# Patient Record
Sex: Female | Born: 1975 | Race: Black or African American | Hispanic: No | Marital: Married | Smoking: Never smoker
Health system: Southern US, Community
[De-identification: ages and names within clinical notes are randomized; demographics above are authoritative.]

## PROBLEM LIST (undated history)

## (undated) DIAGNOSIS — R531 Weakness: Secondary | ICD-10-CM

## (undated) DIAGNOSIS — R200 Anesthesia of skin: Secondary | ICD-10-CM

## (undated) HISTORY — DX: Anesthesia of skin: R20.0

## (undated) HISTORY — PX: OTHER SURGICAL HISTORY: SHX169

## (undated) HISTORY — DX: Weakness: R53.1

## (undated) HISTORY — PX: ANKLE SURGERY: SHX546

---

## 2016-08-11 ENCOUNTER — Other Ambulatory Visit: Payer: Self-pay | Admitting: Obstetrics and Gynecology

## 2016-08-11 ENCOUNTER — Other Ambulatory Visit (HOSPITAL_COMMUNITY)
Admission: RE | Admit: 2016-08-11 | Discharge: 2016-08-11 | Disposition: A | Discharge status: Home / Self Care | Payer: Managed Care, Other (non HMO) | Source: Ambulatory Visit | Attending: Obstetrics and Gynecology | Admitting: Obstetrics and Gynecology

## 2016-08-11 DIAGNOSIS — Z1231 Encounter for screening mammogram for malignant neoplasm of breast: Secondary | ICD-10-CM

## 2016-08-11 DIAGNOSIS — Z113 Encounter for screening for infections with a predominantly sexual mode of transmission: Secondary | ICD-10-CM | POA: Insufficient documentation

## 2016-08-11 DIAGNOSIS — Z1151 Encounter for screening for human papillomavirus (HPV): Secondary | ICD-10-CM | POA: Insufficient documentation

## 2016-08-11 DIAGNOSIS — Z01411 Encounter for gynecological examination (general) (routine) with abnormal findings: Secondary | ICD-10-CM | POA: Insufficient documentation

## 2016-08-13 LAB — CYTOLOGY - PAP
CHLAMYDIA, DNA PROBE: NEGATIVE
DIAGNOSIS: NEGATIVE
HPV: NOT DETECTED
NEISSERIA GONORRHEA: NEGATIVE

## 2016-09-08 ENCOUNTER — Encounter: Payer: Self-pay | Admitting: Radiology

## 2016-09-08 ENCOUNTER — Ambulatory Visit
Admission: RE | Admit: 2016-09-08 | Discharge: 2016-09-08 | Disposition: A | Discharge status: Home / Self Care | Payer: Managed Care, Other (non HMO) | Source: Ambulatory Visit | Attending: Obstetrics and Gynecology | Admitting: Obstetrics and Gynecology

## 2016-09-08 DIAGNOSIS — Z1231 Encounter for screening mammogram for malignant neoplasm of breast: Secondary | ICD-10-CM

## 2018-03-17 ENCOUNTER — Other Ambulatory Visit: Payer: Self-pay

## 2018-03-17 ENCOUNTER — Emergency Department (HOSPITAL_BASED_OUTPATIENT_CLINIC_OR_DEPARTMENT_OTHER): Payer: 59

## 2018-03-17 ENCOUNTER — Encounter (HOSPITAL_BASED_OUTPATIENT_CLINIC_OR_DEPARTMENT_OTHER): Payer: Self-pay

## 2018-03-17 ENCOUNTER — Emergency Department (HOSPITAL_BASED_OUTPATIENT_CLINIC_OR_DEPARTMENT_OTHER)
Admission: EM | Admit: 2018-03-17 | Discharge: 2018-03-17 | Disposition: A | Discharge status: Home / Self Care | Payer: 59 | Attending: Emergency Medicine | Admitting: Emergency Medicine

## 2018-03-17 DIAGNOSIS — R200 Anesthesia of skin: Secondary | ICD-10-CM | POA: Diagnosis not present

## 2018-03-17 DIAGNOSIS — R531 Weakness: Secondary | ICD-10-CM | POA: Diagnosis not present

## 2018-03-17 LAB — COMPREHENSIVE METABOLIC PANEL
ALBUMIN: 4 g/dL (ref 3.5–5.0)
ALK PHOS: 60 U/L (ref 38–126)
ALT: 13 U/L — ABNORMAL LOW (ref 14–54)
ANION GAP: 7 (ref 5–15)
AST: 15 U/L (ref 15–41)
BUN: 11 mg/dL (ref 6–20)
CO2: 24 mmol/L (ref 22–32)
Calcium: 8.7 mg/dL — ABNORMAL LOW (ref 8.9–10.3)
Chloride: 107 mmol/L (ref 101–111)
Creatinine, Ser: 0.7 mg/dL (ref 0.44–1.00)
GFR calc Af Amer: >60 mL/min (ref >60)
GFR calc non Af Amer: >60 mL/min (ref >60)
GLUCOSE: 90 mg/dL (ref 65–99)
POTASSIUM: 3.9 mmol/L (ref 3.5–5.1)
SODIUM: 138 mmol/L (ref 135–145)
Total Bilirubin: 0.5 mg/dL (ref 0.3–1.2)
Total Protein: 7.5 g/dL (ref 6.5–8.1)

## 2018-03-17 LAB — CBC WITH DIFFERENTIAL/PLATELET
BASOS PCT: 0 %
Basophils Absolute: 0 10*3/uL (ref 0.0–0.1)
Eosinophils Absolute: 0.1 10*3/uL (ref 0.0–0.7)
Eosinophils Relative: 3 %
HEMATOCRIT: 37.2 % (ref 36.0–46.0)
HEMOGLOBIN: 12.7 g/dL (ref 12.0–15.0)
LYMPHS PCT: 56 %
Lymphs Abs: 2.5 10*3/uL (ref 0.7–4.0)
MCH: 29.6 pg (ref 26.0–34.0)
MCHC: 34.1 g/dL (ref 30.0–36.0)
MCV: 86.7 fL (ref 78.0–100.0)
Monocytes Absolute: 0.5 10*3/uL (ref 0.1–1.0)
Monocytes Relative: 11 %
NEUTROS ABS: 1.3 10*3/uL — AB (ref 1.7–7.7)
NEUTROS PCT: 30 %
Platelets: 217 10*3/uL (ref 150–400)
RBC: 4.29 MIL/uL (ref 3.87–5.11)
RDW: 13.4 % (ref 11.5–15.5)
WBC: 4.4 10*3/uL (ref 4.0–10.5)

## 2018-03-17 LAB — URINALYSIS, ROUTINE W REFLEX MICROSCOPIC
Bilirubin Urine: NEGATIVE
Glucose, UA: NEGATIVE mg/dL
HGB URINE DIPSTICK: NEGATIVE
Ketones, ur: NEGATIVE mg/dL
LEUKOCYTES UA: NEGATIVE
Nitrite: NEGATIVE
Protein, ur: NEGATIVE mg/dL
SPECIFIC GRAVITY, URINE: 1.015 (ref 1.005–1.030)
pH: 7 (ref 5.0–8.0)

## 2018-03-17 LAB — PREGNANCY, URINE: Preg Test, Ur: NEGATIVE

## 2018-03-17 NOTE — ED Provider Notes (Signed)
Emergency Department Provider Note   I have reviewed the triage vital signs and the nursing notes.   HISTORY  Chief Complaint Numbness   HPI Rebecca Holden is a 42 y.o. female with no significant past medical history presents to the emergency department with intermittent numbness going in the left upper extremity and left thigh.  Patient has had some associated weakness.  Symptoms have been intermittent over the past 2 weeks but over the past 2 days have become more constant.  She denies any face or vision symptoms.  No headaches.  No fevers or chills.  History of similar.  No family history of early strokes.  No radiation of symptoms or modifying factors.  No pain.   History reviewed. No pertinent past medical history.  There are no active problems to display for this patient.   History reviewed. No pertinent surgical history.    Allergies Patient has no known allergies.  No family history on file.  Social History Social History   Tobacco Use  . Smoking status: Never Smoker  . Smokeless tobacco: Never Used  Substance Use Topics  . Alcohol use: Yes    Comment: occ  . Drug use: Never    Review of Systems  Constitutional: No fever/chills Eyes: No visual changes. ENT: No sore throat. Cardiovascular: Denies chest pain. Respiratory: Denies shortness of breath. Gastrointestinal: No abdominal pain.  No nausea, no vomiting.  No diarrhea.  No constipation. Genitourinary: Negative for dysuria. Musculoskeletal: Negative for back pain. Skin: Negative for rash. Neurological: Negative for headaches. Positive subjective weakness in the LUE. Positive numbness in the LUE and LLE (thigh only).   10-point ROS otherwise negative.  ____________________________________________   PHYSICAL EXAM:  VITAL SIGNS: ED Triage Vitals  Enc Vitals Group     BP 03/17/18 1310 122/75     Pulse Rate 03/17/18 1310 (!) 53     Resp 03/17/18 1310 18     Temp 03/17/18 1310 97.9 F (36.6  C)     Temp Source 03/17/18 1310 Oral     SpO2 03/17/18 1310 100 %     Weight 03/17/18 1311 197 lb 8.5 oz (89.6 kg)     Height 03/17/18 1311  (1.575 m)     Pain Score 03/17/18 1309 0   Constitutional: Alert and oriented. Well appearing and in no acute distress. Eyes: Conjunctivae are normal. PERRL. EOMI. Head: Atraumatic. Nose: No congestion/rhinnorhea. Mouth/Throat: Mucous membranes are moist.   Neck: No stridor.  Cardiovascular: Normal rate, regular rhythm. Good peripheral circulation. Grossly normal heart sounds.   Respiratory: Normal respiratory effort.  No retractions. Lungs CTAB. Gastrointestinal: Soft and nontender. No distention.  Musculoskeletal: No lower extremity tenderness nor edema. No gross deformities of extremities. Neurologic:  Normal speech and language. Normal CN exam 2-12. 4+/5 grip strength on the LUE but no pronator drift. Subjective decreased sensation to light touch over the LUE compared to the right.  Skin:  Skin is warm, dry and intact. No rash noted.   ____________________________________________   LABS (all labs ordered are listed, but only abnormal results are displayed)  Labs Reviewed  URINALYSIS, ROUTINE W REFLEX MICROSCOPIC - Abnormal; Notable for the following components:      Result Value   APPearance HAZY (*)    All other components within normal limits  CBC WITH DIFFERENTIAL/PLATELET - Abnormal; Notable for the following components:   Neutro Abs 1.3 (*)    All other components within normal limits  COMPREHENSIVE METABOLIC PANEL - Abnormal; Notable  for the following components:   Calcium 8.7 (*)    ALT 13 (*)    All other components within normal limits  PREGNANCY, URINE   ____________________________________________  EKG   EKG Interpretation  Date/Time:  Wednesday Mar 17 2018 15:17:36 EDT Ventricular Rate:  50 PR Interval:    QRS Duration: 85 QT Interval:  478 QTC Calculation: 436 R Axis:   47 Text Interpretation:  Sinus  rhythm Prolonged PR interval No STEMI.  Confirmed by Alona Bene [phone]) on 03/17/2018 3:26:46 PM       ____________________________________________  RADIOLOGY  Ct Head Wo Contrast  Result Date: 03/17/2018 CLINICAL DATA:  42 year old female with LEFT arm and LEFT leg numbness and dizziness for 2 weeks EXAM: CT HEAD WITHOUT CONTRAST TECHNIQUE: Contiguous axial images were obtained from the base of the skull through the vertex without intravenous contrast. COMPARISON:  None. FINDINGS: Brain: No evidence of infarction, hemorrhage, hydrocephalus, extra-axial collection or mass lesion/mass effect. Vascular: No hyperdense vessel or unexpected calcification. Skull: Normal. Negative for fracture or focal lesion. Sinuses/Orbits: No acute finding. Other: None. IMPRESSION: Unremarkable noncontrast head CT. Electronically Signed   By: Harmon Pier M.D.   On: 03/17/2018 15:43    ____________________________________________   PROCEDURES  Procedure(s) performed:   Procedures  None ____________________________________________   INITIAL IMPRESSION / ASSESSMENT AND PLAN / ED COURSE  Pertinent labs & imaging results that were available during my care of the patient were reviewed by me and considered in my medical decision making (see chart for details).  Patient presents to the emergency department for evaluation of tingling and numbness in the left upper extremity as well as left thigh.  There is some subjective numbness with a slight decreased grip strength on the left.  No significant pronator drift.  Patient has normal cranial nerve exam.  Plan for CT head, labs, EKG, along with tele-neurology consult.  EKG reviewed with no acute findings. Spoke with Tele-Neurology Dr. Ezzard Standing who will perform an on-screen evaluation of the patient and give further recommendations.   Spoke with Dr. Ezzard Standing on screen after evaluation. Plan for discharge for follow up with outpatient Neurology. Provided ambulatory  referral for [facility] but also contact information for multiple local neurology groups. Patient verbalizes understanding.   At this time, I do not feel there is any life-threatening condition present. I have reviewed and discussed all results (EKG, imaging, lab, urine as appropriate), exam findings with patient. I have reviewed nursing notes and appropriate previous records.  I feel the patient is safe to be discharged home without further emergent workup. Discussed usual and customary return precautions. Patient and family (if present) verbalize understanding and are comfortable with this plan.  Patient will follow-up with their primary care provider. If they do not have a primary care provider, information for follow-up has been provided to them. All questions have been answered.  ____________________________________________  FINAL CLINICAL IMPRESSION(S) / ED DIAGNOSES  Final diagnoses:  Numbness     Note:  This document was prepared using Dragon voice recognition software and may include unintentional dictation errors.  Alona Bene, MD Emergency Medicine    Mckyle Solanki, Arlyss Repress, MD 03/18/18 [phone]

## 2018-03-17 NOTE — ED Notes (Signed)
Pt on monitor 

## 2018-03-17 NOTE — Discharge Instructions (Signed)
You were seen in the ED today with numbness. There is some concern for possible Multiple Sclerosis but more testing is needed before we can make this diagnosis. I am listing two Neurology groups in [place] to call today and try to make an appointment ASAP. They will need to schedule an MRI from there.   You can also call [facility] at [phone] as a local option.   Return to the ED with any new or worsening symptoms.

## 2018-03-17 NOTE — Consult Note (Signed)
   [facility] Consult Services  Date of Service: 5/20 Rebecca Holden 2019  Impression:  1. Symptoms and clinical course not consistent with stroke 2. I am concerned about the possibility of multiple sclerosis.  Recommendations: discharge, refer to local neurologist for MRI without and with contrast and follow-up.  ---------------------------------------------------------------------  CC: left-sided numbness and weakness  History of Present Illness: this is a 42 year old woman previous in good health [name] two weeks his head intermittent numbness of her left thigh than her left arm. She has dizziness and what she means is that would she stand she veers to one side. This is visible. She has some left arm and leg weakness. She has no difficulty with thinking, vision loss, diplopia, or swallowing. She has no history of hypertension and her blood pressure is 122/79. She is no history of diabetes, coronary disease, atrial fibrillation, hyperlipidemia, and she is not anticoagulated. She is a non-smoker.   Diagnostic Testing: CT brain was essentially normal although I wonder about white matter Lucent C.  Vital Signs: as above  Exam:  Mental Status:  Awake, alert, oriented  Speech: fluent  Cranial Nerves:  Extraocular movements: Intact in all cardinal gaze, no nystagmus Ptosis: Absent Visual fields: Intact to finger counting Facial sensation: Intact to light touch Facial movements: Intact and symmetric    Motor Exam:  mild drift left arm and leg   Tremor/Abnormal Movements:  Resting tremor: Absent Intention tremor: Absent Postural tremor: Absent  Sensory Exam:   Light touch: diminished on left compared to right    Coordination:   Finger to nose: Intact Heel to shin: mildly ataxic on the left  Medical Decision Making:  - Extensive number of diagnosis or management options are considered above.   - Extensive amount of complex data reviewed.   - High risk of  complication and/or morbidity or mortality are associated with differential diagnostic considerations above.  - There may be uncertain outcome and increased probability of prolonged functional impairment or high probability of severe prolonged functional impairment associated with some of these differential diagnosis.   Medical Data Reviewed:  1.Data reviewed include clinical labs, radiology,  Medical Tests;   2.Tests results discussed w/performing or interpreting physician;   3.Obtaining/reviewing old medical records;  4.Obtaining case history from another source;  5.Independent review of image, tracing or specimen.    Patient was informed the Neurology Consult would happen via TeleHealth consult by way of interactive audio and video telecommunications and consented to receiving care in this manner.

## 2018-03-17 NOTE — ED Triage Notes (Signed)
C/o numbness to left UE and LE x 2 weeks-denies injury-NAD-steady gait

## 2018-03-17 NOTE — ED Notes (Signed)
[facility] neuro eval at bedside

## 2018-03-23 ENCOUNTER — Telehealth: Payer: Self-pay | Admitting: Neurology

## 2018-03-23 ENCOUNTER — Ambulatory Visit (INDEPENDENT_AMBULATORY_CARE_PROVIDER_SITE_OTHER): Payer: 59 | Admitting: Neurology

## 2018-03-23 ENCOUNTER — Encounter: Payer: Self-pay | Admitting: Neurology

## 2018-03-23 VITALS — BP 110/73 | HR 57 | Ht 62.0 in | Wt 198.0 lb

## 2018-03-23 DIAGNOSIS — R202 Paresthesia of skin: Secondary | ICD-10-CM | POA: Insufficient documentation

## 2018-03-23 NOTE — Progress Notes (Signed)
PATIENT: Rebecca Holden DOB: 05/26/1976  Chief Complaint  Patient presents with  . Numbness    Reports intermittent numbness in left upper extremity and left thigh.  She is also experiencing weakness.  Symptoms have been present for at least three weeks.  Recent visit to ED with unremarkable CT head performed.  Marland Kitchen. PCP    Mila PalmerWolters, Sharon, MD (referred from ED)     HISTORICAL  Rebecca Holden is a 42 years old female, seen in refer by her primary care physician Dr. Paulino RilyWolters, Jasmine DecemberSharon for evaluation of left-sided numbness, initial evaluation was on March 23, 2018.  Around 3 weeks ago in the middle of the May 2019, without clear triggers, she began to experience intermittent left-sided numbness, started by left anterior thigh numbness, lasting for a few seconds, but multiple spells in a day, at the end of May 2019, she also noticed numbness spreading to involving her left arm, sparing left face, more frequent, longer lasting up to few minutes, she also felt subjective weakness of left arm and left leg during the spells, she denied loss of consciousness, no headaches,  She also describes one episode in early May 2019, after hearing a joke, she laughed so hard, she felt transient "shut down", she is a wearing of the surroundings, could not move, she has no loss of consciousness, lasting for few seconds.  She presented to the emergency room on Mar 17, 2018, Had a CT head without contrast showed no significant abnormality. Laboratory showed normal CBC CMP  REVIEW OF SYSTEMS: Full 14 system review of systems performed and notable only for numbness, weakness, dizziness  ALLERGIES: No Known Allergies  HOME MEDICATIONS: No current outpatient medications on file.   No current facility-administered medications for this visit.     PAST MEDICAL HISTORY: Past Medical History:  Diagnosis Date  . Numbness   . Weakness     PAST SURGICAL HISTORY: Past Surgical History:  Procedure Laterality  Date  . ANKLE SURGERY Right   . c-section     x 2    FAMILY HISTORY: Family History  Problem Relation Age of Onset  . Healthy Mother   . CVA Father     SOCIAL HISTORY:  Social History   Socioeconomic History  . Marital status: Married    Spouse name: Not on file  . Number of children: 2  . Years of education: 16  . Highest education level: Bachelor's degree (e.g., BA, AB, BS)  Occupational History  . 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 Financial resource strain: Not on file  . Food insecurity:    Worry: Not on file    Inability: Not on file  . Transportation needs:    Medical: Not on file    Non-medical: Not on file  Tobacco Use  . Smoking status: Never Smoker  . Smokeless tobacco: Never Used  Substance and Sexual Activity  . Alcohol use: Yes    Comment: one drink per week  . Drug use: Never  . Sexual activity: Not on file  Lifestyle  . Physical activity:    Days per week: Not on file    Minutes per session: Not on file  . Stress: Not on file  Relationships  . Social connections:    Talks on phone: Not on file    Gets together: Not on file    Attends religious service: Not on file    Active member of club or organization: Not on file  Attends meetings of clubs or organizations: Not on file    Relationship status: Not on file  . Intimate partner violence:    Fear of current or ex partner: Not on file    Emotionally abused: Not on file    Physically abused: Not on file    Forced sexual activity: Not on file  Other Topics Concern  . Not on file  Social History Narrative   Lives at home with husband and two children.   Right-handed.   One cup caffeine daily.     PHYSICAL EXAM   Vitals:   03/23/18 0723  BP: 110/73  Pulse: (!) 57  Weight: 198 lb (89.8 kg)  Height: 5\' 2"  (1.575 m)    Not recorded      Body mass index is 36.21 kg/m.  PHYSICAL EXAMNIATION:  Gen: NAD, conversant, well nourised, obese, well groomed                      Cardiovascular: Regular rate rhythm, no peripheral edema, warm, nontender. Eyes: Conjunctivae clear without exudates or hemorrhage Neck: Supple, no carotid bruits. Pulmonary: Clear to auscultation bilaterally   NEUROLOGICAL EXAM:  MENTAL STATUS: Speech:    Speech is normal; fluent and spontaneous with normal comprehension.  Cognition:     Orientation to time, place and person     Normal recent and remote memory     Normal Attention span and concentration     Normal Language, naming, repeating,spontaneous speech     Fund of knowledge   CRANIAL NERVES: CN II: Visual fields are full to confrontation. Fundoscopic exam is normal with sharp discs and no vascular changes. Pupils are round equal and briskly reactive to light. CN III, IV, VI: extraocular movement are normal. No ptosis. CN V: Facial sensation is intact to pinprick in all 3 divisions bilaterally. Corneal responses are intact.  CN VII: Face is symmetric with normal eye closure and smile. CN VIII: Hearing is normal to rubbing fingers CN IX, X: Palate elevates symmetrically. Phonation is normal. CN XI: Head turning and shoulder shrug are intact CN XII: Tongue is midline with normal movements and no atrophy.  MOTOR: There is no pronator drift of out-stretched arms. Muscle bulk and tone are normal. Muscle strength is normal.  REFLEXES: Reflexes are 2+ and symmetric at the biceps, triceps, knees, and ankles. Plantar responses are flexor.  SENSORY: Intact to light touch, pinprick, positional sensation and vibratory sensation are intact in fingers and toes.  COORDINATION: Rapid alternating movements and fine finger movements are intact. There is no dysmetria on finger-to-nose and heel-knee-shin.    GAIT/STANCE: Posture is normal. Gait is steady with normal steps, base, arm swing, and turning. Heel and toe walking are normal. Tandem gait is normal.  Romberg is absent.   DIAGNOSTIC DATA (LABS, IMAGING,  TESTING) - I reviewed patient records, labs, notes, testing and imaging myself where available.   ASSESSMENT AND PLAN  Rebecca Holden is a 42 y.o. female   Intermittent left-sided paresthesia involving left leg, left arm,  Need to rule out right hemisphere pathology  Proceed with MRI of brain without contrast  Laboratory evaluation including TSH, B12   Levert Feinstein, M.D. Ph.D.  [facility] [address] [place], [place] [address] Ph: [phone] Fax: [phone]  CC: Mila Palmer, MD

## 2018-03-23 NOTE — Telephone Encounter (Signed)
[employer] order sent to GI. They obtain the auth and will reach out to the pt to schedule.  °

## 2018-03-24 ENCOUNTER — Telehealth: Payer: Self-pay | Admitting: *Deleted

## 2018-03-24 LAB — RPR: RPR Ser Ql: NONREACTIVE

## 2018-03-24 LAB — TSH: TSH: 1.11 u[IU]/mL (ref 0.450–4.500)

## 2018-03-24 LAB — VITAMIN B12: Vitamin B-12: 774 pg/mL (ref 232–1245)

## 2018-03-24 NOTE — Telephone Encounter (Signed)
Left patient a detailed message, with results, on voicemail (ok per DPR).  Provided our number to call back with any questions.  

## 2018-03-24 NOTE — Telephone Encounter (Signed)
-----   Message from Yijun Yan, MD sent at 03/24/2018 11:34 AM EDT ----- Please call patient for normal laboratory result 

## 2018-03-29 ENCOUNTER — Emergency Department (HOSPITAL_COMMUNITY): Payer: 59

## 2018-03-29 ENCOUNTER — Emergency Department (HOSPITAL_BASED_OUTPATIENT_CLINIC_OR_DEPARTMENT_OTHER)
Admission: EM | Admit: 2018-03-29 | Discharge: 2018-03-30 | Disposition: A | Discharge status: Home / Self Care | Payer: 59 | Attending: Emergency Medicine | Admitting: Emergency Medicine

## 2018-03-29 ENCOUNTER — Encounter (HOSPITAL_BASED_OUTPATIENT_CLINIC_OR_DEPARTMENT_OTHER): Payer: Self-pay

## 2018-03-29 ENCOUNTER — Other Ambulatory Visit: Payer: Self-pay

## 2018-03-29 DIAGNOSIS — R200 Anesthesia of skin: Secondary | ICD-10-CM | POA: Diagnosis not present

## 2018-03-29 DIAGNOSIS — R29898 Other symptoms and signs involving the musculoskeletal system: Secondary | ICD-10-CM | POA: Diagnosis not present

## 2018-03-29 NOTE — ED Notes (Signed)
Report given to Baird Lyonsasey, RN, [facility]

## 2018-03-29 NOTE — ED Notes (Signed)
Pt updated on wait time for MRI and thanked for being patient. Pt verbalized understanding

## 2018-03-29 NOTE — ED Notes (Signed)
This RN called MRI and they stated it would be at least 2 hours until she will be scanned

## 2018-03-29 NOTE — ED Notes (Signed)
Pt states that earlier today she had some numbness to left side of face, that has somewhat resolved.  Pt states that she also feels like the numbness that has been present on the left side, is worse today.  According to friend that was with her, when she had the facial numbness, she did not have a droop, speech was clear and pt was able to swallow PO fluids at the time without difficulty.

## 2018-03-29 NOTE — ED Notes (Signed)
Pt states seen here a few weeks ago for numbness to the lt side and has a appt for an MRI on Thursday, states today the numbness is going up the lt side of face/neck; no distress noted

## 2018-03-29 NOTE — ED Provider Notes (Signed)
[facility] EMERGENCY DEPARTMENT Provider Note   CSN: [id number] Arrival date & time: 03/29/18  1607     History   Chief Complaint Chief Complaint  Patient presents with  . Numbness    HPI Rebecca Holden is a 42 y.o. female.  The history is provided by the patient, a friend and medical records.  Neurologic Problem  This is a new problem. The current episode started more than 1 week ago. The problem occurs constantly. The problem has been gradually worsening. Pertinent negatives include no chest pain, no abdominal pain, no headaches and no shortness of breath. Nothing aggravates the symptoms. Nothing relieves the symptoms. She has tried nothing for the symptoms. The treatment provided no relief.    Past Medical History:  Diagnosis Date  . Numbness   . Weakness     Patient Active Problem List   Diagnosis Date Noted  . Paresthesia of left arm and leg 03/23/2018    Past Surgical History:  Procedure Laterality Date  . ANKLE SURGERY Right   . c-section     x 2     OB History   None      Home Medications    Prior to Admission medications   Not on File    Family History Family History  Problem Relation Age of Onset  . Healthy Mother   . CVA Father     Social History Social History   Tobacco Use  . Smoking status: Never Smoker  . Smokeless tobacco: Never Used  Substance Use Topics  . Alcohol use: Yes    Comment: one drink per week  . Drug use: Never     Allergies   Patient has no known allergies.   Review of Systems Review of Systems  Constitutional: Negative for chills, diaphoresis, fatigue and fever.  HENT: Negative for congestion.   Eyes: Negative for photophobia and visual disturbance.  Respiratory: Negative for cough, chest tightness, shortness of breath and wheezing.   Cardiovascular: Negative for chest pain.  Gastrointestinal: Negative for abdominal pain.  Genitourinary: Negative for flank pain.  Musculoskeletal: Negative  for back pain, neck pain and neck stiffness.  Neurological: Positive for weakness and numbness. Negative for dizziness, seizures, syncope, facial asymmetry, speech difficulty, light-headedness and headaches.  Psychiatric/Behavioral: Negative for agitation.  All other systems reviewed and are negative.    Physical Exam Updated Vital Signs BP 111/74   Pulse 64   Temp 98.3 F (36.8 C) (Oral)   Resp 16   Ht 5\' 2"  (1.575 m)   Wt 88.9 kg (196 lb)   LMP 02/28/2018   SpO2 100%   BMI 35.85 kg/m   Physical Exam  Constitutional: She is oriented to person, place, and time. She appears well-developed and well-nourished. No distress.  HENT:  Head: Normocephalic and atraumatic.  Nose: Nose normal.  Mouth/Throat: Oropharynx is clear and moist. No oropharyngeal exudate.  Eyes: Pupils are equal, round, and reactive to light. Conjunctivae and EOM are normal.  Neck: Normal range of motion. Neck supple.  Cardiovascular: Normal rate and regular rhythm. Exam reveals no gallop.  No murmur heard. Pulmonary/Chest: Effort normal and breath sounds normal. No respiratory distress. She has no wheezes. She exhibits no tenderness.  Abdominal: Soft. There is no tenderness.  Musculoskeletal: She exhibits no edema or tenderness.  Lymphadenopathy:    She has no cervical adenopathy.  Neurological: She is alert and oriented to person, place, and time. She is not disoriented. She displays no tremor. A sensory deficit  is present. No cranial nerve deficit. She exhibits abnormal muscle tone. Coordination normal. GCS eye subscore is 4. GCS verbal subscore is 5. GCS motor subscore is 6.  Decreased sensation left face, left arm, and left leg.  Decreased grip strength on left compared to right.  Decreased hip strength and plantar flexion/dorsiflexion of foot strength in left compared to right.  Symmetric reflexes in bilateral patellas.  Normal coordination.  Normal extraocular movements and pupil exam.  No facial droop.   Clear speech.  Skin: Skin is warm and dry. Capillary refill takes less than 2 seconds. No rash noted. She is not diaphoretic. No erythema.  Psychiatric: She has a normal mood and affect.  Nursing note and vitals reviewed.    ED Treatments / Results  Labs (all labs ordered are listed, but only abnormal results are displayed) Labs Reviewed - No data to display  EKG None  Radiology No results found.  Procedures Procedures (including critical care time)  Medications Ordered in ED Medications - No data to display   Initial Impression / Assessment and Plan / ED Course  I have reviewed the triage vital signs and the nursing notes.  Pertinent labs & imaging results that were available during my care of the patient were reviewed by me and considered in my medical decision making (see chart for details).     Rebecca Holden is a 42 y.o. female with a past medical history of recent left arm and left leg numbness/weakness who presents with continued left arm and left leg weakness and now facial numbness.  Patient says that for the last week and a half she has had intermittent left sided symptoms.  She initially came to this facility where she had CT scan that was reassuring.  Patient spoke with the tele-neurology team and was found to be safe for discharge home for outpatient neurology management.  Next  Patient says and augmentation sports the patient saw her outpatient neurologist, Dr. Terrace Arabia, with [facility] several days ago who ordered outpatient MRI to look for stroke or other abnormality causing her symptoms.  Next para patient says her symptoms have continued on and off but today started having numbness in the left face.  Patient says she has not had facial involvement during this time and did not have facial droop.  She denies any vision changes, speech difficulties, headaches, neck pain or neck stiffness.  She denies any other symptoms including no urinary symptoms GI  symptoms cough congestion shortness with chest pain or abdominal pain.  She denies recent trauma.  She is right-handed.  She says she is still having the intermittent weakness in the left arm and left leg.  On exam, patient did have decreased grip strength in the left compared to the right and decreased plantarflexion and dorsiflexion in the left foot compared to the right.  Patient had subjective numbness in the left face, left arm, left leg.  Face was symmetric with normal extraocular movements and pupil reactivity.  Patient had no other abnormality on my exam.  Patient will be transferred to [facility] for MRI without contrast to continue her work-up of the abnormality's.  Spoke with neurology and they agreed with not doing a code stroke on her at this time.  Patient will be transferred POV at patient request and as her symptoms have been ongoing for over a week.  If MRI is reassuring, anticipate discharge to continue her [facility].  If MRI is abnormal,  anticipate speaking with the neurology team at [facility].   Patient transferred in good condition.  Accepting physician is Dr. Ranae Palms in the emergency department.      Final Clinical Impressions(s) / ED Diagnoses   Final diagnoses:  Left sided numbness  Left arm weakness  Left leg weakness     Clinical Impression: 1. Left sided numbness   2. Left arm weakness   3. Left leg weakness     Disposition: Patient transferred to [facility] for MRI to further evaluate her left-sided neurologic symptoms.  If MRI is reassuring, patient will be stable for discharge to continue her outpatient neurology work-up and management with [facility].  If MRI is positive, contact neurology.     Tegeler, Canary Brim, MD 03/30/18 0110

## 2018-03-30 NOTE — Discharge Instructions (Signed)
The MRI of your brain today was reassuring.  We recommend that you follow-up with neurology for your ongoing symptoms.  You may return for any new or concerning symptoms.

## 2018-03-30 NOTE — ED Provider Notes (Signed)
12:37 AM Patient care assumed in transfer.  [facility]; [facility].  Plan documented to include discharge if MRI negative.  Patient's imaging has been reviewed today and does not show any emergent intracranial process.  She is being followed by neurology on an outpatient basis.  I have encouraged close follow-up with her neurology provider.  Return precautions provided at discharge. Patient discharged in stable condition with no unaddressed concerns.   Results for orders placed or performed in visit on 03/23/18  TSH  Result Value Ref Range   TSH 1.110 0.450 - 4.500 uIU/mL  RPR  Result Value Ref Range   RPR Ser Ql Non Reactive Non Reactive  Vitamin B12  Result Value Ref Range   Vitamin B-12 774 232 - 1,245 pg/mL   Ct Head Wo Contrast  Result Date: 03/17/2018 CLINICAL DATA:  42 year old female with LEFT arm and LEFT leg numbness and dizziness for 2 weeks EXAM: CT HEAD WITHOUT CONTRAST TECHNIQUE: Contiguous axial images were obtained from the base of the skull through the vertex without intravenous contrast. COMPARISON:  None. FINDINGS: Brain: No evidence of infarction, hemorrhage, hydrocephalus, extra-axial collection or mass lesion/mass effect. Vascular: No hyperdense vessel or unexpected calcification. Skull: Normal. Negative for fracture or focal lesion. Sinuses/Orbits: No acute finding. Other: None. IMPRESSION: Unremarkable noncontrast head CT. Electronically Signed   By: Harmon PierJeffrey  Hu M.D.   On: 03/17/2018 15:43   Mr Brain Wo Contrast  Result Date: 03/29/2018 CLINICAL DATA:  Left-sided facial numbness EXAM: MRI HEAD WITHOUT CONTRAST TECHNIQUE: Multiplanar, multiecho pulse sequences of the brain and surrounding structures were obtained without intravenous contrast. COMPARISON:  None. FINDINGS: BRAIN: There is no acute infarct, acute hemorrhage or mass effect. The midline structures are normal. The white matter signal is normal for the  patient's age. There are no old infarcts. The CSF spaces are normal for age, with no hydrocephalus. Susceptibility-sensitive sequences show no chronic microhemorrhage or superficial siderosis. VASCULAR: Major intracranial arterial and venous sinus flow voids are preserved. SKULL AND UPPER CERVICAL SPINE: The visualized skull base, calvarium, upper cervical spine and extracranial soft tissues are normal. SINUSES/ORBITS: No fluid levels or advanced mucosal thickening. No mastoid or middle ear effusion. The orbits are normal. IMPRESSION: Normal MRI of the brain. Electronically Signed   By: Deatra RobinsonKevin  Herman M.D.   On: 03/29/2018 22:53      Antony MaduraHumes, Alaura Schippers, PA-C 03/30/18 0040    Zadie RhineWickline, Donald, MD 03/30/18 [phone]

## 2018-04-01 ENCOUNTER — Inpatient Hospital Stay: Admission: RE | Admit: 2018-04-01 | Payer: 59 | Source: Ambulatory Visit

## 2018-06-07 ENCOUNTER — Ambulatory Visit: Payer: 59 | Admitting: Neurology

## 2018-11-05 ENCOUNTER — Other Ambulatory Visit: Payer: Self-pay | Admitting: Obstetrics and Gynecology

## 2018-11-05 ENCOUNTER — Other Ambulatory Visit (HOSPITAL_COMMUNITY)
Admission: RE | Admit: 2018-11-05 | Discharge: 2018-11-05 | Disposition: A | Discharge status: Home / Self Care | Payer: 59 | Source: Ambulatory Visit | Attending: Obstetrics and Gynecology | Admitting: Obstetrics and Gynecology

## 2018-11-05 DIAGNOSIS — Z113 Encounter for screening for infections with a predominantly sexual mode of transmission: Secondary | ICD-10-CM | POA: Insufficient documentation

## 2018-11-05 DIAGNOSIS — Z01419 Encounter for gynecological examination (general) (routine) without abnormal findings: Secondary | ICD-10-CM | POA: Insufficient documentation

## 2018-11-05 DIAGNOSIS — Z1231 Encounter for screening mammogram for malignant neoplasm of breast: Secondary | ICD-10-CM

## 2018-11-09 LAB — CYTOLOGY - PAP
Chlamydia: NEGATIVE
Diagnosis: NEGATIVE
Neisseria Gonorrhea: NEGATIVE

## 2018-11-18 ENCOUNTER — Ambulatory Visit: Payer: 59 | Admitting: Neurology

## 2018-12-07 ENCOUNTER — Ambulatory Visit
Admission: RE | Admit: 2018-12-07 | Discharge: 2018-12-07 | Disposition: A | Discharge status: Home / Self Care | Payer: 59 | Source: Ambulatory Visit | Attending: Obstetrics and Gynecology | Admitting: Obstetrics and Gynecology

## 2018-12-07 DIAGNOSIS — Z1231 Encounter for screening mammogram for malignant neoplasm of breast: Secondary | ICD-10-CM

## 2019-01-12 ENCOUNTER — Other Ambulatory Visit: Payer: Self-pay

## 2019-01-12 ENCOUNTER — Encounter: Payer: Self-pay | Admitting: Neurology

## 2019-01-12 ENCOUNTER — Telehealth: Payer: Self-pay | Admitting: Neurology

## 2019-01-12 ENCOUNTER — Ambulatory Visit (INDEPENDENT_AMBULATORY_CARE_PROVIDER_SITE_OTHER): Payer: 59 | Admitting: Neurology

## 2019-01-12 DIAGNOSIS — R202 Paresthesia of skin: Secondary | ICD-10-CM | POA: Diagnosis not present

## 2019-01-12 NOTE — Progress Notes (Signed)
Virtual Visit via Video  I connected with Rebecca Holden on 01/12/19 at  by Video and verified that I am speaking with the correct person using two identifiers.   I discussed the limitations, risks, security and privacy concerns of performing an evaluation and management service by Video and the availability of in person appointments. I also discussed with the patient that there may be a patient responsible charge related to this service. The patient expressed understanding and agreed to proceed.   History of Present Illness: Rebecca Holden is a 43 years old female, seen in refer by her primary care physician Dr. Paulino Rily, Jasmine December for evaluation of left-sided numbness, initial evaluation was on March 23, 2018.  Around 3 weeks ago in the middle of the May 2019, without clear triggers, she began to experience intermittent left-sided numbness, started by left anterior thigh numbness, lasting for a few seconds, but multiple spells in a day, at the end of May 2019, she also noticed numbness spreading to involving her left arm, sparing left face, more frequent, longer lasting up to few minutes, she also felt subjective weakness of left arm and left leg during the spells, she denied loss of consciousness, no headaches, got it  She also describes one episode in early May 2019, after hearing a joke, she laughed so hard, she felt transient "shut down", she is a wearing of the surroundings, could not move, she has no loss of consciousness, lasting for few seconds.  She presented to the emergency room on Mar 17, 2018, Had a CT head without contrast showed no significant abnormality. Laboratory showed normal CBC CMP  She continues to have intermittent paresthesia involving her left face, upper and lower extremity, once or twice each week, each episode last about 2 to 3 minutes, there was no limitations in her function, no loss of consciousness, not painful,  I have personally reviewed MRI of brain in June  2019 that was normal.  Laboratory evaluation showed normal B12, RPR, TSH, CMP, CBC    Observations/Objective: I have reviewed problem lists, medications, allergies.  Patient was alert oriented to history taking and care of conversation, eye movement was normal, facial symmetric, moving upper and lower extremity without difficulties.  Assessment and Plan:  Extensive evaluations detailed above showed normal MRI of the brain, laboratory evaluations,  We will continue to observe her symptoms only return to clinic for new issues.  Follow Up Instructions: Intermittent left side paresthesia     I discussed the assessment and treatment plan with the patient. The patient was provided an opportunity to ask questions and all were answered. The patient agreed with the plan and demonstrated an understanding of the instructions.   The patient was advised to call back or seek an in-person evaluation if the symptoms worsen or if the condition fails to improve as anticipated.  I provided 11 minutes of non-face-to-face time during this encounter.   Levert Feinstein, MD

## 2019-01-12 NOTE — Telephone Encounter (Signed)
I returned call to the patient and she would like a video visit.  She has been scheduled to day at 1pm with Dr. Terrace Arabia.

## 2019-01-12 NOTE — Telephone Encounter (Signed)
Pt would like and is consenting to a telephone appt or her scheduled appt on the 30th due to self quarantine. Please advise.

## 2019-01-13 NOTE — Addendum Note (Signed)
Addended by: Levert Feinstein on: 01/13/2019 04:36 PM   Modules accepted: Level of Service

## 2019-01-17 ENCOUNTER — Ambulatory Visit: Payer: 59 | Admitting: Neurology

## 2019-07-22 IMAGING — CT CT HEAD W/O CM
3 series · 16 of 47 positions shown, 19 images · non-contrast
Comparison: None.

CLINICAL DATA: 41-year-old female with LEFT arm and LEFT leg
numbness and dizziness for 2 weeks

EXAM:
CT HEAD WITHOUT CONTRAST
TECHNIQUE: Contiguous axial images were obtained from the base of the skull
through the vertex without intravenous contrast.

[Series 2: head wo · axial · 0.43mm/px · z∈[-155,-15]mm · 10 of 34 slices shown, 13 images]
[im 3/34  brain]
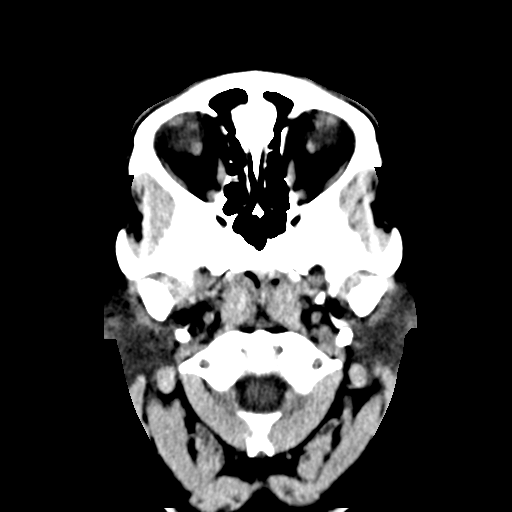
[im 3/34  bone]
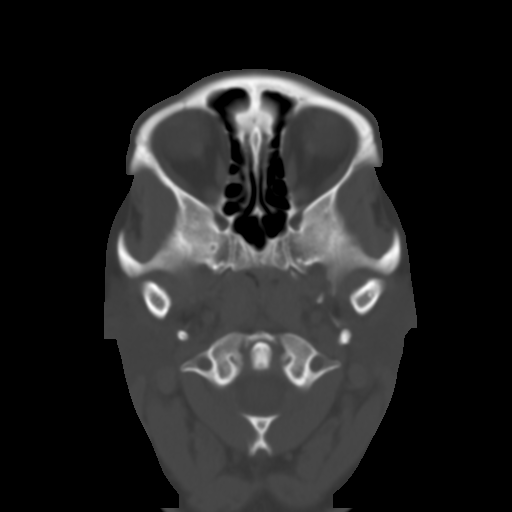
[im 6/34  brain]
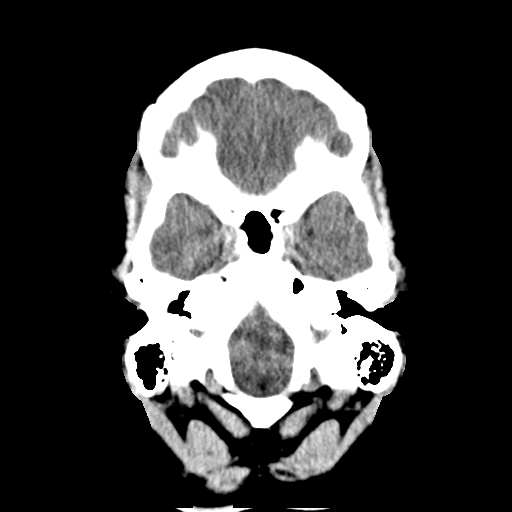
[im 10/34  brain]
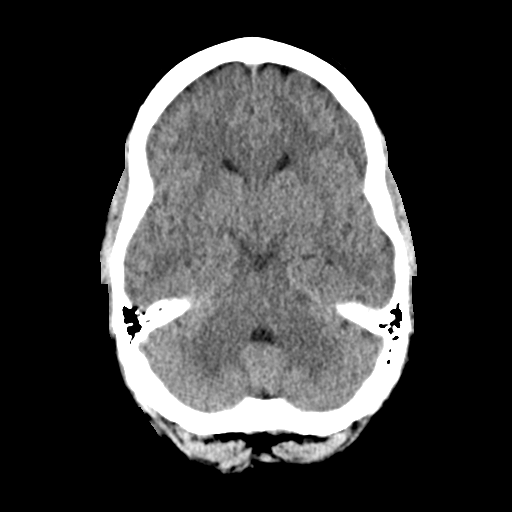
[im 12/34  brain]
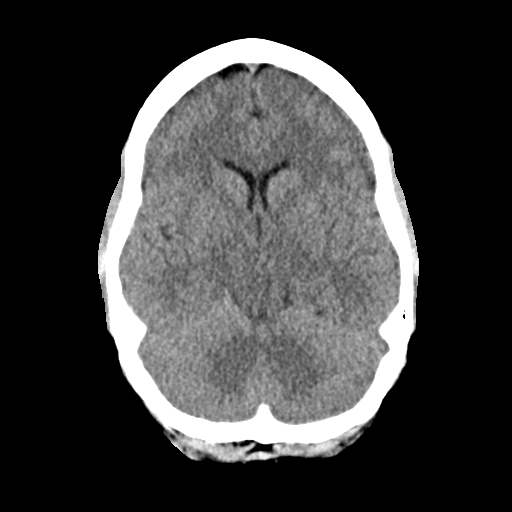
[im 15/34  brain]
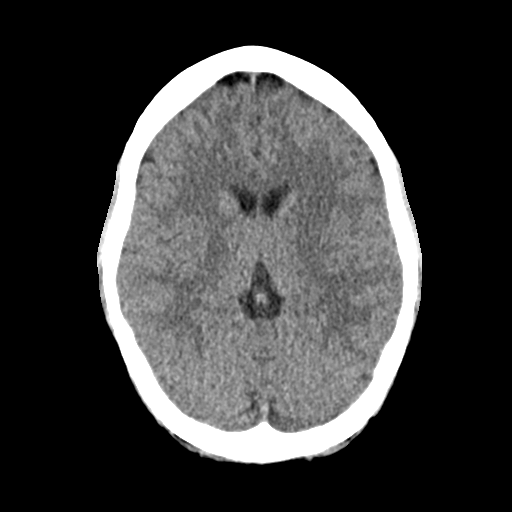
[im 15/34  bone]
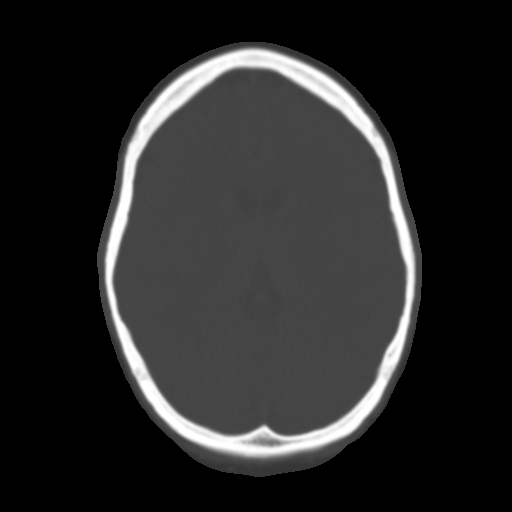
[im 19/34  brain]
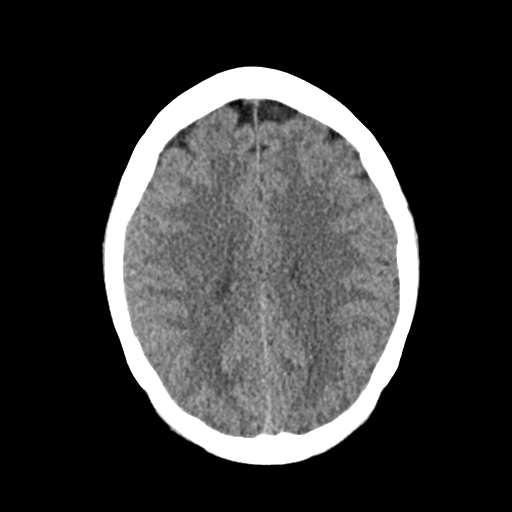
[im 22/34  brain]
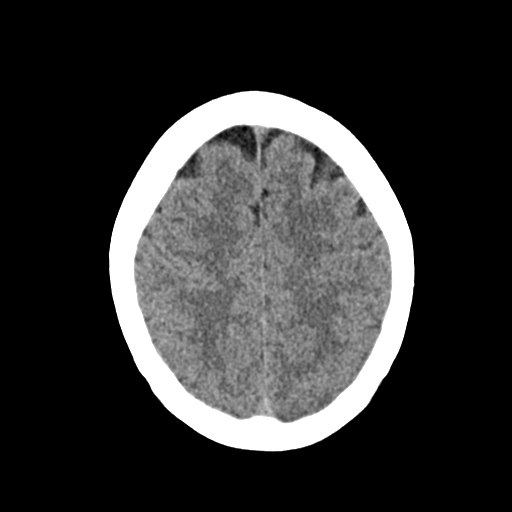
[im 26/34  brain]
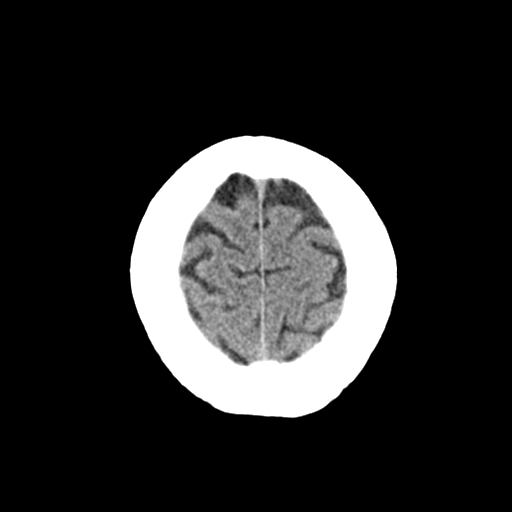
[im 28/34  brain]
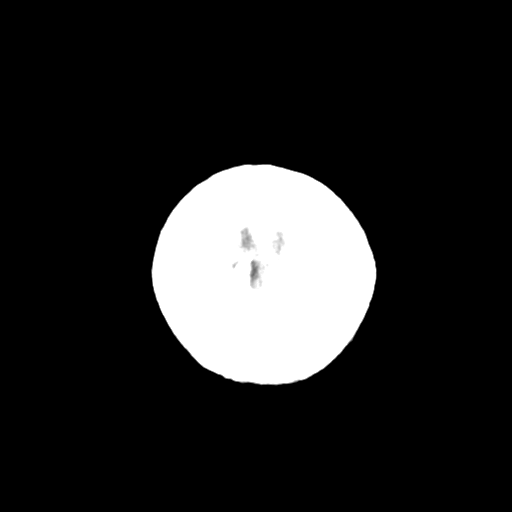
[im 28/34  bone]
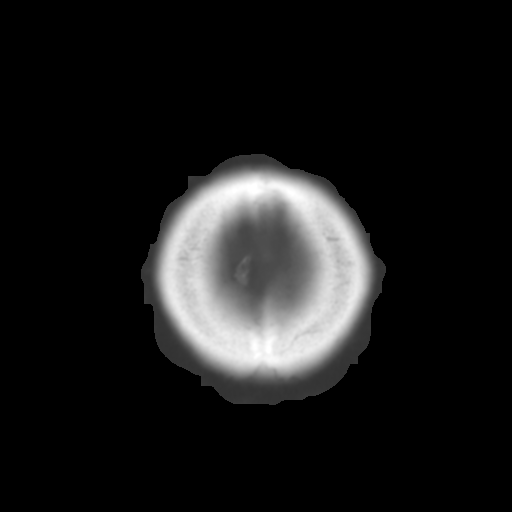
[im 31/34  brain]
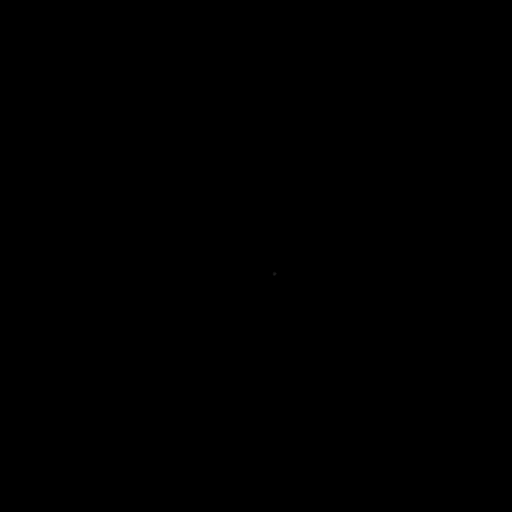

[Series 4: coronal soft · coronal · 0.34mm/px · 3 of 69 slices shown]
[im 23/69  brain]
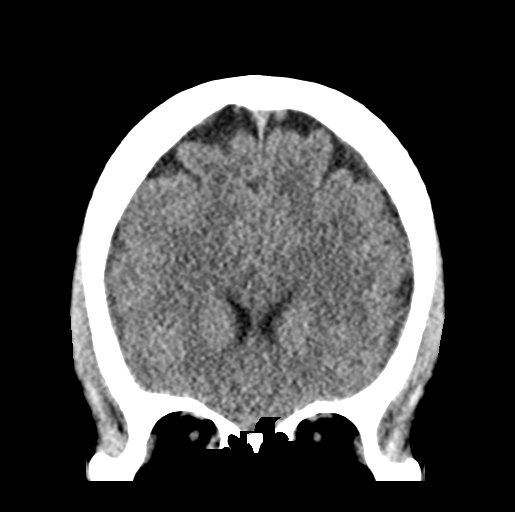
[im 31/69  brain]
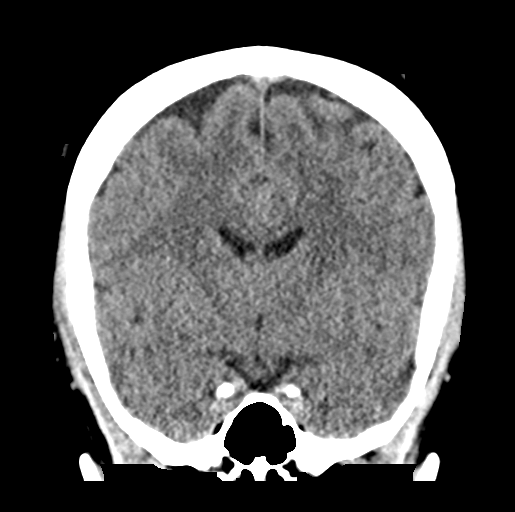
[im 38/69  brain]
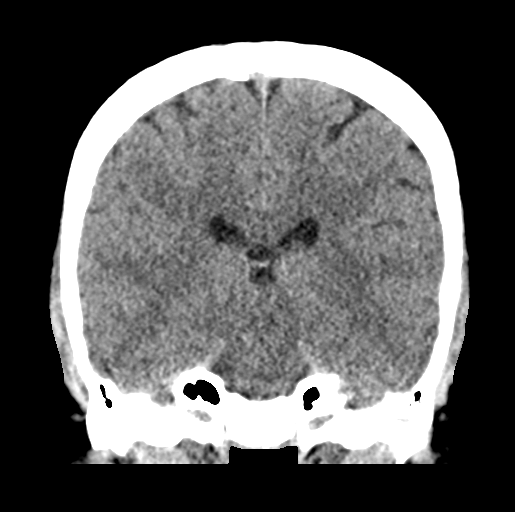

[Series 5: sag soft · sagittal · 0.32mm/px · 3 of 54 slices shown]
[im 18/54  brain]
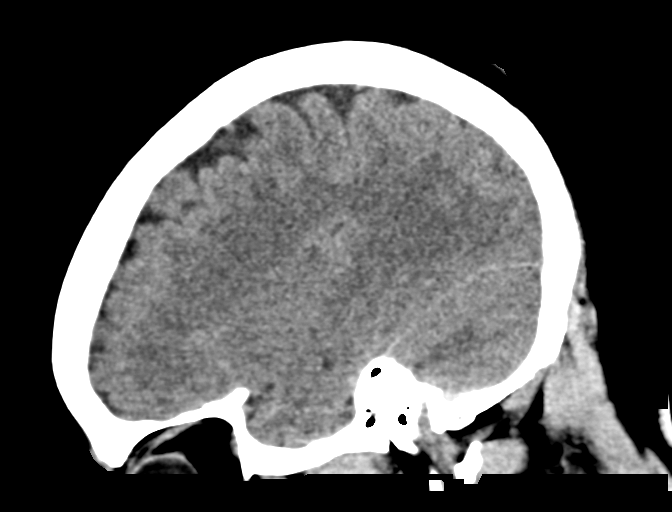
[im 27/54  brain]
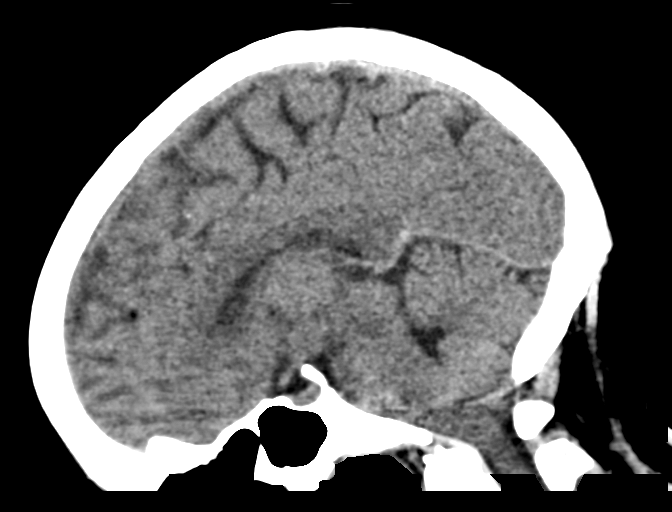
[im 36/54  brain]
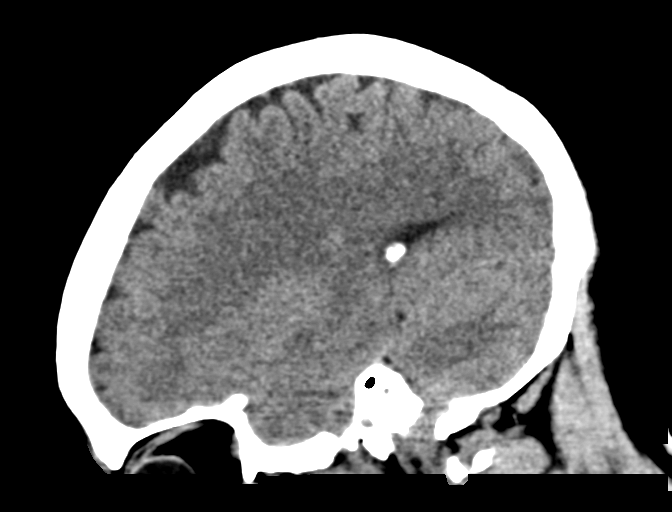

[16 of 47 positions shown; findings below may reference images not displayed]

FINDINGS: Brain: No evidence of infarction, hemorrhage, hydrocephalus,
extra-axial collection or mass lesion/mass effect.

Vascular: No hyperdense vessel or unexpected calcification.

Skull: Normal. Negative for fracture or focal lesion.

Sinuses/Orbits: No acute finding.

Other: None.
IMPRESSION: Unremarkable noncontrast head CT.

## 2019-11-02 ENCOUNTER — Other Ambulatory Visit: Payer: Self-pay | Admitting: Obstetrics and Gynecology

## 2019-11-02 DIAGNOSIS — Z1231 Encounter for screening mammogram for malignant neoplasm of breast: Secondary | ICD-10-CM

## 2019-12-09 ENCOUNTER — Other Ambulatory Visit: Payer: Self-pay

## 2019-12-09 ENCOUNTER — Ambulatory Visit
Admission: RE | Admit: 2019-12-09 | Discharge: 2019-12-09 | Disposition: A | Discharge status: Home / Self Care | Payer: Managed Care, Other (non HMO) | Source: Ambulatory Visit | Attending: Obstetrics and Gynecology | Admitting: Obstetrics and Gynecology

## 2019-12-09 DIAGNOSIS — Z1231 Encounter for screening mammogram for malignant neoplasm of breast: Secondary | ICD-10-CM

## 2020-11-19 ENCOUNTER — Other Ambulatory Visit: Payer: Self-pay | Admitting: Obstetrics and Gynecology

## 2020-11-19 ENCOUNTER — Other Ambulatory Visit: Payer: Self-pay | Admitting: Physician Assistant

## 2020-11-19 DIAGNOSIS — Z1231 Encounter for screening mammogram for malignant neoplasm of breast: Secondary | ICD-10-CM

## 2021-02-15 ENCOUNTER — Other Ambulatory Visit: Payer: Self-pay

## 2021-02-15 ENCOUNTER — Ambulatory Visit
Admission: RE | Admit: 2021-02-15 | Discharge: 2021-02-15 | Disposition: A | Discharge status: Home / Self Care | Payer: Managed Care, Other (non HMO) | Source: Ambulatory Visit | Attending: Physician Assistant | Admitting: Physician Assistant

## 2021-02-15 DIAGNOSIS — Z1231 Encounter for screening mammogram for malignant neoplasm of breast: Secondary | ICD-10-CM

## 2022-03-20 ENCOUNTER — Other Ambulatory Visit: Payer: Self-pay | Admitting: Family Medicine

## 2022-03-20 ENCOUNTER — Other Ambulatory Visit: Payer: Self-pay | Admitting: Physician Assistant

## 2022-03-20 DIAGNOSIS — Z1231 Encounter for screening mammogram for malignant neoplasm of breast: Secondary | ICD-10-CM

## 2022-03-21 ENCOUNTER — Ambulatory Visit
Admission: RE | Admit: 2022-03-21 | Discharge: 2022-03-21 | Disposition: A | Discharge status: Home / Self Care | Payer: Medicaid Other | Source: Ambulatory Visit | Attending: Family Medicine | Admitting: Family Medicine

## 2022-03-21 DIAGNOSIS — Z1231 Encounter for screening mammogram for malignant neoplasm of breast: Secondary | ICD-10-CM

## 2022-03-25 ENCOUNTER — Other Ambulatory Visit: Payer: Self-pay | Admitting: Family Medicine

## 2022-03-25 DIAGNOSIS — R928 Other abnormal and inconclusive findings on diagnostic imaging of breast: Secondary | ICD-10-CM

## 2022-04-01 ENCOUNTER — Ambulatory Visit: Payer: Medicaid Other

## 2022-04-01 ENCOUNTER — Other Ambulatory Visit: Payer: Self-pay | Admitting: Obstetrics and Gynecology

## 2022-04-01 ENCOUNTER — Ambulatory Visit
Admission: RE | Admit: 2022-04-01 | Discharge: 2022-04-01 | Disposition: A | Discharge status: Home / Self Care | Payer: Medicaid Other | Source: Ambulatory Visit | Attending: Family Medicine | Admitting: Family Medicine

## 2022-04-01 DIAGNOSIS — R928 Other abnormal and inconclusive findings on diagnostic imaging of breast: Secondary | ICD-10-CM

## 2022-04-07 ENCOUNTER — Ambulatory Visit
Admission: RE | Admit: 2022-04-07 | Discharge: 2022-04-07 | Disposition: A | Discharge status: Home / Self Care | Payer: Medicaid Other | Source: Ambulatory Visit | Attending: Family Medicine | Admitting: Family Medicine

## 2022-04-07 DIAGNOSIS — R928 Other abnormal and inconclusive findings on diagnostic imaging of breast: Secondary | ICD-10-CM

## 2022-07-14 ENCOUNTER — Other Ambulatory Visit: Payer: Self-pay

## 2022-07-14 ENCOUNTER — Encounter (HOSPITAL_COMMUNITY): Payer: Self-pay

## 2022-07-14 ENCOUNTER — Emergency Department (HOSPITAL_COMMUNITY)
Admission: EM | Admit: 2022-07-14 | Discharge: 2022-07-14 | Discharge status: Against Medical Advice | Payer: Medicaid Other | Attending: Emergency Medicine | Admitting: Emergency Medicine

## 2022-07-14 DIAGNOSIS — Z5321 Procedure and treatment not carried out due to patient leaving prior to being seen by health care provider: Secondary | ICD-10-CM | POA: Diagnosis not present

## 2022-07-14 DIAGNOSIS — J029 Acute pharyngitis, unspecified: Secondary | ICD-10-CM | POA: Diagnosis not present

## 2022-07-14 DIAGNOSIS — H9201 Otalgia, right ear: Secondary | ICD-10-CM | POA: Insufficient documentation

## 2022-07-14 NOTE — ED Triage Notes (Signed)
Pt reports with right ear pain and sore throat x 2 days.

## 2022-07-15 ENCOUNTER — Emergency Department (HOSPITAL_COMMUNITY): Payer: Medicaid Other

## 2022-07-15 ENCOUNTER — Encounter (HOSPITAL_COMMUNITY): Payer: Self-pay

## 2022-07-15 ENCOUNTER — Inpatient Hospital Stay (HOSPITAL_COMMUNITY)
Admission: EM | Admit: 2022-07-15 | Discharge: 2022-07-17 | DRG: 153 | Disposition: A | Discharge status: Home / Self Care | Payer: Medicaid Other | Attending: Internal Medicine | Admitting: Internal Medicine

## 2022-07-15 DIAGNOSIS — R130 Aphagia: Secondary | ICD-10-CM | POA: Diagnosis not present

## 2022-07-15 DIAGNOSIS — J0430 Supraglottitis, unspecified, without obstruction: Secondary | ICD-10-CM | POA: Diagnosis present

## 2022-07-15 DIAGNOSIS — E669 Obesity, unspecified: Secondary | ICD-10-CM | POA: Diagnosis present

## 2022-07-15 DIAGNOSIS — J029 Acute pharyngitis, unspecified: Secondary | ICD-10-CM | POA: Diagnosis present

## 2022-07-15 DIAGNOSIS — Z20822 Contact with and (suspected) exposure to covid-19: Secondary | ICD-10-CM | POA: Diagnosis present

## 2022-07-15 DIAGNOSIS — Z6841 Body Mass Index (BMI) 40.0 and over, adult: Secondary | ICD-10-CM

## 2022-07-15 DIAGNOSIS — Z823 Family history of stroke: Secondary | ICD-10-CM

## 2022-07-15 DIAGNOSIS — J36 Peritonsillar abscess: Principal | ICD-10-CM | POA: Diagnosis present

## 2022-07-15 LAB — COMPREHENSIVE METABOLIC PANEL
ALT: 24 U/L (ref 0–44)
AST: 21 U/L (ref 15–41)
Albumin: 4.9 g/dL (ref 3.5–5.0)
Alkaline Phosphatase: 85 U/L (ref 38–126)
Anion gap: 8 (ref 5–15)
BUN: 9 mg/dL (ref 6–20)
CO2: 25 mmol/L (ref 22–32)
Calcium: 9.3 mg/dL (ref 8.9–10.3)
Chloride: 106 mmol/L (ref 98–111)
Creatinine, Ser: 0.87 mg/dL (ref 0.44–1.00)
GFR, Estimated: >60 mL/min (ref >60)
Glucose, Bld: 94 mg/dL (ref 70–99)
Potassium: 3.5 mmol/L (ref 3.5–5.1)
Sodium: 139 mmol/L (ref 135–145)
Total Bilirubin: 1 mg/dL (ref 0.3–1.2)
Total Protein: 9.3 g/dL — ABNORMAL HIGH (ref 6.5–8.1)

## 2022-07-15 LAB — CBC WITH DIFFERENTIAL/PLATELET
Abs Immature Granulocytes: 0.03 10*3/uL (ref 0.00–0.07)
Basophils Absolute: 0 10*3/uL (ref 0.0–0.1)
Basophils Relative: 0 %
Eosinophils Absolute: 0 10*3/uL (ref 0.0–0.5)
Eosinophils Relative: 0 %
HCT: 46.9 % — ABNORMAL HIGH (ref 36.0–46.0)
Hemoglobin: 15.8 g/dL — ABNORMAL HIGH (ref 12.0–15.0)
Immature Granulocytes: 0 %
Lymphocytes Relative: 18 %
Lymphs Abs: 1.8 10*3/uL (ref 0.7–4.0)
MCH: 31.2 pg (ref 26.0–34.0)
MCHC: 33.7 g/dL (ref 30.0–36.0)
MCV: 92.7 fL (ref 80.0–100.0)
Monocytes Absolute: 0.9 10*3/uL (ref 0.1–1.0)
Monocytes Relative: 9 %
Neutro Abs: 7.7 10*3/uL (ref 1.7–7.7)
Neutrophils Relative %: 73 %
Platelets: 265 10*3/uL (ref 150–400)
RBC: 5.06 MIL/uL (ref 3.87–5.11)
RDW: 12.4 % (ref 11.5–15.5)
WBC: 10.5 10*3/uL (ref 4.0–10.5)
nRBC: 0 % (ref 0.0–0.2)

## 2022-07-15 LAB — HIV ANTIBODY (ROUTINE TESTING W REFLEX): HIV Screen 4th Generation wRfx: NONREACTIVE

## 2022-07-15 LAB — GROUP A STREP BY PCR: Group A Strep by PCR: NOT DETECTED

## 2022-07-15 LAB — MONONUCLEOSIS SCREEN: Mono Screen: NEGATIVE

## 2022-07-15 LAB — SARS CORONAVIRUS 2 BY RT PCR: SARS Coronavirus 2 by RT PCR: NEGATIVE

## 2022-07-15 MED ORDER — ONDANSETRON HCL 4 MG PO TABS: 4.0000 mg | ORAL_TABLET | Freq: Four times a day (QID) | ORAL | Status: DC | PRN

## 2022-07-15 MED ORDER — SODIUM CHLORIDE 0.9 % IV SOLN
3.0000 g | Freq: Four times a day (QID) | INTRAVENOUS | Status: DC
  Administered 2022-07-15 – 2022-07-17 (×7): 3 g via INTRAVENOUS
  Filled 2022-07-15 (×8): qty 8

## 2022-07-15 MED ORDER — ENOXAPARIN SODIUM 60 MG/0.6ML IJ SOSY
0.5000 mg/kg | PREFILLED_SYRINGE | INTRAMUSCULAR | Status: DC
  Administered 2022-07-15: 52.5 mg via SUBCUTANEOUS
  Filled 2022-07-15 (×2): qty 0.6

## 2022-07-15 MED ORDER — SODIUM CHLORIDE 0.9 % IV SOLN
3.0000 g | Freq: Once | INTRAVENOUS | Status: AC
  Administered 2022-07-15: 3 g via INTRAVENOUS
  Filled 2022-07-15: qty 8

## 2022-07-15 MED ORDER — DEXAMETHASONE SODIUM PHOSPHATE 10 MG/ML IJ SOLN
10.0000 mg | Freq: Once | INTRAMUSCULAR | Status: AC
  Administered 2022-07-15: 10 mg via INTRAVENOUS
  Filled 2022-07-15: qty 1

## 2022-07-15 MED ORDER — LACTATED RINGERS IV SOLN: INTRAVENOUS | Status: DC

## 2022-07-15 MED ORDER — IOHEXOL 300 MG/ML  SOLN
80.0000 mL | Freq: Once | INTRAMUSCULAR | Status: AC | PRN
  Administered 2022-07-15: 75 mL via INTRAVENOUS

## 2022-07-15 MED ORDER — DEXAMETHASONE SODIUM PHOSPHATE 4 MG/ML IJ SOLN
4.0000 mg | Freq: Four times a day (QID) | INTRAMUSCULAR | Status: DC
  Administered 2022-07-15 – 2022-07-17 (×7): 4 mg via INTRAVENOUS
  Filled 2022-07-15 (×9): qty 1

## 2022-07-15 MED ORDER — FENTANYL CITRATE PF 50 MCG/ML IJ SOSY
50.0000 ug | PREFILLED_SYRINGE | Freq: Once | INTRAMUSCULAR | Status: AC
  Administered 2022-07-15: 50 ug via INTRAVENOUS
  Filled 2022-07-15: qty 1

## 2022-07-15 MED ORDER — SODIUM CHLORIDE 0.9 % IV BOLUS
1000.0000 mL | Freq: Once | INTRAVENOUS | Status: AC
  Administered 2022-07-15: 1000 mL via INTRAVENOUS

## 2022-07-15 MED ORDER — ONDANSETRON HCL 4 MG/2ML IJ SOLN: 4.0000 mg | Freq: Four times a day (QID) | INTRAMUSCULAR | Status: DC | PRN

## 2022-07-15 MED ORDER — HYDROMORPHONE HCL 1 MG/ML IJ SOLN: 0.5000 mg | INTRAMUSCULAR | Status: DC | PRN

## 2022-07-15 MED ORDER — ACETAMINOPHEN 650 MG RE SUPP: 650.0000 mg | Freq: Four times a day (QID) | RECTAL | Status: DC | PRN

## 2022-07-15 MED ORDER — ACETAMINOPHEN 325 MG PO TABS: 650.0000 mg | ORAL_TABLET | Freq: Four times a day (QID) | ORAL | Status: DC | PRN

## 2022-07-15 NOTE — ED Notes (Signed)
Nurse attempted to complete fluid challenge on patient with water, patient unable to swallow water states it came back up and  the pain was 10/10 stabbing feeling.

## 2022-07-15 NOTE — ED Triage Notes (Signed)
Pt arrived via POV, c/o right ear and throat pain for several days. Progressively worsening, pt spo2 maintained at 97%, but spitting secretions into bag, states unable to swallow them.

## 2022-07-15 NOTE — H&P (Signed)
History and Physical    Rebecca Holden KGU:[id number] DOB: [birth year] DOA: 07/15/2022  I have briefly reviewed the patient's prior medical records in [facility]  PCP: Mila Palmer, MD  Patient coming from: home  Chief Complaint: throat pain, unable to swallow  HPI: Rebecca Holden is a 46 y.o. female without significant medical history.  Comes into the ER with 4 days of worsening throat pain/ear pain.  She is currently unable to swallow.  Has not been able to sleep/eat for last 3 days.  Unable to swallow pills or secretions.  +subjective fever.  Works from home and denies sick contacts  In the ER, CT scan done shows: Prominence of the right palatine and bilateral lingual tonsils.  Associated mucosal/submucosal edema within the right oropharyngeal wall, epiglottis and right greater than left supraglottic larynx. This constellation of findings likely reflects tonsillitis/pharyngitis and supraglottitis. Superimposed 11 mm hypodense focus in the region of the right lingual tonsils, which may reflect an early abscess or focal edema. Close clinical follow-up is recommended (with imaging follow-up as warranted) to ensure symptom resolution with treatment, and to exclude a mucosal/submucosal neoplasm.  ER MD spoke with Dr. Jearld Fenton who suggested admission at [facility] in case intervention needed, steroids, and IV abx.  [facility] will [facility].  Patient has gotten steroids and abx with minimal improvement thus far.   Labs essentially unremarkable.  Mono negative.  COVID pending.   Review of Systems: As per HPI otherwise 10 point review of systems negative.   Past Medical History:  Diagnosis Date   Numbness    Weakness     Past Surgical History:  Procedure Laterality Date   ANKLE SURGERY Right    c-section     x 2     reports that she has never smoked. She has never used smokeless tobacco. She reports current alcohol use. She reports that she does not use drugs.  No Known  Allergies  Family History  Problem Relation Age of Onset   Healthy Mother    CVA Father     Prior to Admission medications   Not on File    Physical Exam: Vitals:   07/15/22 1130 07/15/22 1159 07/15/22 1337 07/15/22 1407  BP: (!) 159/89 (!) 162/87 (!) 150/85 (!) 144/82  Pulse: 86 80 88 86  Resp:  18 18 18   Temp:   99.7 F (37.6 C)   TempSrc:   Oral   SpO2: 98% 99% 100% 97%      Constitutional: NAD, calm, spitting saliva in bag Eyes: PERRL, lids and conjunctivae normal ENMT: Mucous membranes are dry. Unable to see back of throat as patient unable to open mouth fully due to pain- oropharyngeal erythema in the part visualized  Neck: normal, supple, no masses, no thyromegaly Respiratory: clear to auscultation bilaterally, no wheezing, no crackles. Normal respiratory effort. No accessory muscle use.  Cardiovascular: Regular rate and rhythm, no murmurs / rubs / gallops. No extremity edema. 2+ pedal pulses.  Abdomen: no tenderness, no masses palpated. Bowel sounds positive.  Musculoskeletal: no clubbing / cyanosis. Normal muscle tone.  Skin: no rashes, lesions, ulcers. No induration Neurologic: CN 2-12 grossly intact. Strength 5/5 in all 4.  Psychiatric: Normal judgment and insight. Alert and oriented x 3. Normal mood.   Labs on Admission: I have personally reviewed following labs and imaging studies  CBC: Recent Labs  Lab 07/15/22 1127  WBC 10.5  NEUTROABS 7.7  HGB 15.8*  HCT 46.9*  MCV 92.7  PLT 921   Basic Metabolic Panel: Recent Labs  Lab 07/15/22 1127  NA 139  K 3.5  CL 106  CO2 25  GLUCOSE 94  BUN 9  CREATININE 0.87  CALCIUM 9.3   GFR: Estimated Creatinine Clearance: 90.9 mL/min (by C-G formula based on SCr of 0.87 mg/dL). Liver Function Tests: Recent Labs  Lab 07/15/22 1127  AST 21  ALT 24  ALKPHOS 85  BILITOT 1.0  PROT 9.3*  ALBUMIN 4.9   No results for input(s): "LIPASE", "AMYLASE" in the last 168 hours. No results for input(s):  "AMMONIA" in the last 168 hours. Coagulation Profile: No results for input(s): "INR", "PROTIME" in the last 168 hours. Cardiac Enzymes: No results for input(s): "CKTOTAL", "CKMB", "CKMBINDEX", "TROPONINI" in the last 168 hours. BNP (last 3 results) No results for input(s): "PROBNP" in the last 8760 hours. HbA1C: No results for input(s): "HGBA1C" in the last 72 hours. CBG: No results for input(s): "GLUCAP" in the last 168 hours. Lipid Profile: No results for input(s): "CHOL", "HDL", "LDLCALC", "TRIG", "CHOLHDL", "LDLDIRECT" in the last 72 hours. Thyroid Function Tests: No results for input(s): "TSH", "T4TOTAL", "FREET4", "T3FREE", "THYROIDAB" in the last 72 hours. Anemia Panel: No results for input(s): "VITAMINB12", "FOLATE", "FERRITIN", "TIBC", "IRON", "RETICCTPCT" in the last 72 hours.     Radiological Exams on Admission: CT Soft Tissue Neck W Contrast  Result Date: 07/15/2022 CLINICAL DATA:  Provided history: Epiglottitis or tonsillitis suspected. Additional history provided: Patient reports right ear and throat pain for several days, progressively worsening. Difficulty swallowing. EXAM: CT NECK WITH CONTRAST TECHNIQUE: Multidetector CT imaging of the neck was performed using the standard protocol following the bolus administration of intravenous contrast. RADIATION DOSE REDUCTION: This exam was performed according to the departmental dose-optimization program which includes automated exposure control, adjustment of the mA and/or kV according to patient size and/or use of iterative reconstruction technique. CONTRAST:  96mL OMNIPAQUE IOHEXOL 300 MG/ML  SOLN COMPARISON:  None. FINDINGS: Pharynx and larynx: Streak and beam hardening artifact arising from dental restoration partially obscures the oral cavity. Asymmetric prominence of the right palatine tonsil. Mucosal/submucosal edema within the right oropharyngeal wall. Prominence of the lingual tonsils, bilaterally. Superimposed 11 mm  hypodense focus in the region of the right lingual tonsils, which may reflect an early abscess or focal edema (series 3, image 44). Mild thickening of the epiglottis. Mild-to-moderate edema of the supraglottic larynx, greater on the right. Mild airway narrowing, greatest at the level of the oropharynx/epiglottis. Salivary glands: No inflammation, mass, or stone. Thyroid: Unremarkable. Lymph nodes: Right-sided cervical lymphadenopathy. An index right level 2 lymph node measures 19 mm in short axis. Vascular: The major vascular structures of the neck are patent. Limited intracranial: No evidence of acute intracranial abnormality within the field of view. Visualized orbits: Incompletely imaged. No orbital mass or acute orbital finding at the imaged levels. Mastoids and visualized paranasal sinuses: Trace mucosal thickening within the bilateral maxillary and ethmoid sinuses at the imaged levels. No significant mastoid effusion. Skeleton: Reversal of the expected cervical lordosis. Cervical spondylosis. No acute fracture or aggressive osseous lesion. Upper chest: No consolidation within the imaged lung apices. IMPRESSION: Prominence of the right palatine and bilateral lingual tonsils. Associated mucosal/submucosal edema within the right oropharyngeal wall, epiglottis and right greater than left supraglottic larynx. This constellation of findings likely reflects tonsillitis/pharyngitis and supraglottitis. Superimposed 11 mm hypodense focus in the region of the right lingual tonsils, which may reflect an early abscess or focal edema. Close clinical follow-up is recommended (  with imaging follow-up as warranted) to ensure symptom resolution with treatment, and to exclude a mucosal/submucosal neoplasm. Resultant mild airway narrowing, greatest at the level of the oropharynx/epiglottis. Right cervical lymphadenopathy, likely reactive. Close clinical follow-up is recommended (with imaging follow-up as warranted) to ensure  resolution and to exclude alternative etiologies (such as nodal metastatic disease). Electronically Signed   By: Jackey Loge D.O.   On: 07/15/2022 13:40      Assessment/Plan Principal Problem:   Pharyngitis    tonsillitis/pharyngitis and supraglottitis -tx to [facility] for ENT eval -IV steroids -IV abx -IV pain meds -ENT Jearld Fenton to see at [facility] -clear diet as tolerated -r/o COVID/strep     DVT prophylaxis: lovenox  Code Status: full   Disposition Plan: home once able to swallow Consults called: ENT Jearld Fenton)    Admission status: obs   At the point of initial evaluation, it is my clinical opinion that admission for OBSERVATION is reasonable and necessary because the patient's presenting complaints in the context of their chronic conditions represent sufficient risk of deterioration or significant morbidity to constitute reasonable grounds for close observation in the hospital setting, but that the patient may be medically stable for discharge from the hospital within 24 to 48 hours.      Joseph Art [facility]   How to contact the [facility] Attending or Consulting provider 7A - 7P or covering provider during after hours 7P -7A, for this patient?  Check the care team in [facility] and look for a) attending/consulting TR[facility] provider listed and b) the [facility] team listed Log into [email or web address] and use [place]'s universal password to access. If you do not have the password, please contact the hospital operator. Locate the [facility] provider you are looking for under [facility] and page to a number that you can be directly reached. If you still have difficulty reaching the provider, please page the [facility] (Director on Call) for the Hospitalists listed on amion for assistance.  07/15/2022, 2:40 PM

## 2022-07-15 NOTE — ED Provider Notes (Signed)
[facility]-EMERGENCY DEPT Provider Note   CSN: [id number] Arrival date & time: 07/15/22  1002     History  Chief Complaint  Patient presents with   Dysphagia    Rebecca Holden is a 46 y.o. female.  Level 5 caveat, difficulty speaking and difficulty swallowing.  Patient with right-sided throat pain and ear pain for the past 4 days progressively worsening.  Pain started in her right ear has progressed to her right throat and she has difficulty swallowing but no difficulty breathing.  She is spitting into an emesis bag and states she is unable to swallow her secretions.  Unable to eat or drink for the past 3 days.  Tmax at home 99 degrees.  Unable to take Tylenol or Motrin due to throat pain.  Denies chest pain or shortness of breath.  Denies abdominal pain, nausea or vomiting.  Denies any chronic medical conditions or regular medications.  The history is provided by the patient.       Home Medications Prior to Admission medications   Not on File      Allergies    Patient has no known allergies.    Review of Systems   Review of Systems  Unable to perform ROS: Patient nonverbal    Physical Exam Updated Vital Signs BP (!) 151/107 (BP Location: Left Arm)   Pulse (!) 101   Temp 98.4 F (36.9 C) (Oral)   Resp 20   SpO2 97%  Physical Exam Vitals and nursing note reviewed.  Constitutional:      General: She is not in acute distress.    Appearance: She is well-developed.     Comments: No respiratory distress, spitting in emesis bag  HENT:     Head: Normocephalic and atraumatic.     Mouth/Throat:     Pharynx: Posterior oropharyngeal erythema present. No oropharyngeal exudate.     Comments: Difficult to visualize oropharynx fully.  No obvious bulge of soft palate.  Uvula is midline.  Floor mouth is soft. Trismus to approximately 3 cm. Eyes:     Conjunctiva/sclera: Conjunctivae normal.     Pupils: Pupils are equal, round, and reactive to light.   Neck:     Comments: No meningismus. Cardiovascular:     Rate and Rhythm: Normal rate and regular rhythm.     Heart sounds: Normal heart sounds. No murmur heard. Pulmonary:     Effort: Pulmonary effort is normal. No respiratory distress.     Breath sounds: Normal breath sounds.  Abdominal:     Palpations: Abdomen is soft.     Tenderness: There is no abdominal tenderness. There is no guarding or rebound.  Musculoskeletal:        General: No tenderness. Normal range of motion.     Cervical back: Normal range of motion and neck supple.  Lymphadenopathy:     Cervical: Cervical adenopathy present.  Skin:    General: Skin is warm.  Neurological:     Mental Status: She is alert and oriented to person, place, and time.     Cranial Nerves: No cranial nerve deficit.     Motor: No abnormal muscle tone.     Coordination: Coordination normal.     Comments:  5/5 strength throughout. CN 2-12 intact.Equal grip strength.   Psychiatric:        Behavior: Behavior normal.     ED Results / Procedures / Treatments   Labs (all labs ordered are listed, but only abnormal results are displayed) Labs Reviewed  CBC WITH DIFFERENTIAL/PLATELET - Abnormal; Notable for the following components:      Result Value   Hemoglobin 15.8 (*)    HCT 46.9 (*)    All other components within normal limits  COMPREHENSIVE METABOLIC PANEL - Abnormal; Notable for the following components:   Total Protein 9.3 (*)    All other components within normal limits  GROUP A STREP BY PCR  SARS CORONAVIRUS 2 BY RT PCR  MONONUCLEOSIS SCREEN    EKG None  Radiology CT Soft Tissue Neck W Contrast  Result Date: 07/15/2022 CLINICAL DATA:  Provided history: Epiglottitis or tonsillitis suspected. Additional history provided: Patient reports right ear and throat pain for several days, progressively worsening. Difficulty swallowing. EXAM: CT NECK WITH CONTRAST TECHNIQUE: Multidetector CT imaging of the neck was performed using the  standard protocol following the bolus administration of intravenous contrast. RADIATION DOSE REDUCTION: This exam was performed according to the departmental dose-optimization program which includes automated exposure control, adjustment of the mA and/or kV according to patient size and/or use of iterative reconstruction technique. CONTRAST:  16mL OMNIPAQUE IOHEXOL 300 MG/ML  SOLN COMPARISON:  None. FINDINGS: Pharynx and larynx: Streak and beam hardening artifact arising from dental restoration partially obscures the oral cavity. Asymmetric prominence of the right palatine tonsil. Mucosal/submucosal edema within the right oropharyngeal wall. Prominence of the lingual tonsils, bilaterally. Superimposed 11 mm hypodense focus in the region of the right lingual tonsils, which may reflect an early abscess or focal edema (series 3, image 44). Mild thickening of the epiglottis. Mild-to-moderate edema of the supraglottic larynx, greater on the right. Mild airway narrowing, greatest at the level of the oropharynx/epiglottis. Salivary glands: No inflammation, mass, or stone. Thyroid: Unremarkable. Lymph nodes: Right-sided cervical lymphadenopathy. An index right level 2 lymph node measures 19 mm in short axis. Vascular: The major vascular structures of the neck are patent. Limited intracranial: No evidence of acute intracranial abnormality within the field of view. Visualized orbits: Incompletely imaged. No orbital mass or acute orbital finding at the imaged levels. Mastoids and visualized paranasal sinuses: Trace mucosal thickening within the bilateral maxillary and ethmoid sinuses at the imaged levels. No significant mastoid effusion. Skeleton: Reversal of the expected cervical lordosis. Cervical spondylosis. No acute fracture or aggressive osseous lesion. Upper chest: No consolidation within the imaged lung apices. IMPRESSION: Prominence of the right palatine and bilateral lingual tonsils. Associated mucosal/submucosal  edema within the right oropharyngeal wall, epiglottis and right greater than left supraglottic larynx. This constellation of findings likely reflects tonsillitis/pharyngitis and supraglottitis. Superimposed 11 mm hypodense focus in the region of the right lingual tonsils, which may reflect an early abscess or focal edema. Close clinical follow-up is recommended (with imaging follow-up as warranted) to ensure symptom resolution with treatment, and to exclude a mucosal/submucosal neoplasm. Resultant mild airway narrowing, greatest at the level of the oropharynx/epiglottis. Right cervical lymphadenopathy, likely reactive. Close clinical follow-up is recommended (with imaging follow-up as warranted) to ensure resolution and to exclude alternative etiologies (such as nodal metastatic disease). Electronically Signed   By: Kellie Simmering D.O.   On: 07/15/2022 13:40    Procedures Procedures    Medications Ordered in ED Medications  sodium chloride 0.9 % bolus 1,000 mL (has no administration in time range)  dexamethasone (DECADRON) injection 10 mg (has no administration in time range)  Ampicillin-Sulbactam (UNASYN) 3 g in sodium chloride 0.9 % 100 mL IVPB (has no administration in time range)    ED Course/ Medical Decision Making/ A&P  Medical Decision Making Amount and/or Complexity of Data Reviewed Labs: ordered. Decision-making details documented in ED Course. Radiology: ordered and independent interpretation performed. Decision-making details documented in ED Course. ECG/medicine tests: ordered and independent interpretation performed. Decision-making details documented in ED Course.  Risk Prescription drug management. Decision regarding hospitalization.  Progressively worsening sore throat for the past 4 days with difficulty swallowing her secretions.  No respiratory distress.  No soft tissue bulge to suggest peritonsillar abscess.  Consider retropharyngeal abscess,  epiglottitis, tonsillitis, or other deep space infection.  Will give IV fluids, antibiotics, steroids, check labs and CT neck.  CT findings as above show inflammation of right pharynx, larynx, epiglottis with possible early abscess involving right palatine tonsil.  Mild airway narrowing.  Discussed with Dr. Jearld Fenton of ENT.  He reviewed CT scan and agrees no immediate surgical intervention necessary but agrees with admission for IV steroids and antibiotics.  He will consult.  Recommend admission to [facility].  Patient unable to go home was unable to tolerate p.o. not able to swallow pills.  Airway patent at this time.  Discussed with patient who agrees.  We will plan admission for IV steroids and IV antibiotics with ENT evaluation.  Discussed with Dr. Benjamine Mola.        Final Clinical Impression(s) / ED Diagnoses Final diagnoses:  Supraglottitis without airway obstruction    Rx / DC Orders ED Discharge Orders     None         Shrinika Blatz, Jeannett Senior, MD 07/15/22 1512

## 2022-07-16 ENCOUNTER — Other Ambulatory Visit: Payer: Self-pay

## 2022-07-16 DIAGNOSIS — Z823 Family history of stroke: Secondary | ICD-10-CM | POA: Diagnosis not present

## 2022-07-16 DIAGNOSIS — J029 Acute pharyngitis, unspecified: Secondary | ICD-10-CM | POA: Diagnosis present

## 2022-07-16 DIAGNOSIS — E669 Obesity, unspecified: Secondary | ICD-10-CM | POA: Diagnosis present

## 2022-07-16 DIAGNOSIS — Z6841 Body Mass Index (BMI) 40.0 and over, adult: Secondary | ICD-10-CM | POA: Diagnosis not present

## 2022-07-16 DIAGNOSIS — J0430 Supraglottitis, unspecified, without obstruction: Secondary | ICD-10-CM | POA: Diagnosis present

## 2022-07-16 DIAGNOSIS — J36 Peritonsillar abscess: Secondary | ICD-10-CM | POA: Diagnosis present

## 2022-07-16 DIAGNOSIS — Z20822 Contact with and (suspected) exposure to covid-19: Secondary | ICD-10-CM | POA: Diagnosis present

## 2022-07-16 LAB — CBC
HCT: 38.9 % (ref 36.0–46.0)
Hemoglobin: 13.4 g/dL (ref 12.0–15.0)
MCH: 31.5 pg (ref 26.0–34.0)
MCHC: 34.4 g/dL (ref 30.0–36.0)
MCV: 91.3 fL (ref 80.0–100.0)
Platelets: 233 10*3/uL (ref 150–400)
RBC: 4.26 MIL/uL (ref 3.87–5.11)
RDW: 12.4 % (ref 11.5–15.5)
WBC: 11.8 10*3/uL — ABNORMAL HIGH (ref 4.0–10.5)
nRBC: 0 % (ref 0.0–0.2)

## 2022-07-16 LAB — BASIC METABOLIC PANEL
Anion gap: 7 (ref 5–15)
BUN: 13 mg/dL (ref 6–20)
CO2: 21 mmol/L — ABNORMAL LOW (ref 22–32)
Calcium: 8.8 mg/dL — ABNORMAL LOW (ref 8.9–10.3)
Chloride: 111 mmol/L (ref 98–111)
Creatinine, Ser: 0.77 mg/dL (ref 0.44–1.00)
GFR, Estimated: >60 mL/min (ref >60)
Glucose, Bld: 130 mg/dL — ABNORMAL HIGH (ref 70–99)
Potassium: 3.9 mmol/L (ref 3.5–5.1)
Sodium: 139 mmol/L (ref 135–145)

## 2022-07-16 LAB — HIV ANTIBODY (ROUTINE TESTING W REFLEX): HIV Screen 4th Generation wRfx: NONREACTIVE

## 2022-07-16 MED ORDER — ENOXAPARIN SODIUM 60 MG/0.6ML IJ SOSY
50.0000 mg | PREFILLED_SYRINGE | INTRAMUSCULAR | Status: DC
  Administered 2022-07-16: 50 mg via SUBCUTANEOUS
  Filled 2022-07-16 (×2): qty 0.6

## 2022-07-16 NOTE — Progress Notes (Signed)
[facility] PROGRESS NOTE   Rebecca Holden JAS:[id number] DOB: [birth year] DOA: 07/15/2022  PCP: Mila Palmer, MD  Brief History/Interval Summary: 46 y.o. female without significant medical history. Comes into the ER with 4 days of worsening throat pain/ear pain.  She also has had difficulty swallowing.  In the ER, CT scan done shows: Prominence of the right palatine and bilateral lingual tonsils.  Associated mucosal/submucosal edema within the right oropharyngeal wall, epiglottis and right greater than left supraglottic larynx. This constellation of findings likely reflects tonsillitis/pharyngitis and supraglottitis. Superimposed 11 mm hypodense focus in the region of the right lingual tonsils, which may reflect an early abscess or focal edema. Close clinical follow-up is recommended (with imaging follow-up as warranted) to ensure symptom resolution with treatment, and to exclude a mucosal/submucosal neoplasm.  Discussions with ENT, Dr. Jearld Fenton patient was started on IV antibiotics dexamethasone.  COVID-19 was negative.  Consultants: Phone discussion with Dr. Jearld Fenton with ENT  Procedures: None    Subjective/Interval History: Patient mentions that she is feeling much better this morning compared to yesterday.  She is able to swallow liquids.    Assessment/Plan:  Acute pharyngitis with concern for small tonsillar abscess CT scan raised concern for tonsillitis and perhaps a small abscess.  Yesterday patient had a lot of swelling in her throat and neck.  Had difficulty swallowing.  Patient was given dexamethasone and started on Unasyn.  She feels much better this morning.  Has been able to swallow ice chips.   Discussed with ENT.  Reasonable to leave her here at this campus for now since her airway has improved.  We will give her full liquid diet and advance as tolerated.  Continue IV antibiotics for another day.  If she continues to improve then we can transition to oral antibiotics and  do a steroid taper starting tomorrow. Group A strep screen was negative.  Mono screen was negative.  COVID-19 test was negative. Dr. Jearld Fenton does not feel that it is necessary for her to follow-up with ENT if her symptoms subside.  Obesity Estimated body mass index is 41.52 kg/m as calculated from the following:   Height as of 07/14/22: 5\' 2"  (1.575 m).   Weight as of 07/14/22: 103 kg.   DVT Prophylaxis: Lovenox Code Status: Full code Family Communication: Discussed with patient Disposition Plan: Hopefully return home in improved  Status is: Observation The patient will require care spanning > 2 midnights and should be moved to inpatient because: Need for IV antibiotics      Medications: Scheduled:  dexamethasone (DECADRON) injection  4 mg Intravenous Q6H   enoxaparin (LOVENOX) injection  0.5 mg/kg Subcutaneous Q24H   Continuous:  ampicillin-sulbactam (UNASYN) IV 3 g (07/16/22 0549)   lactated ringers 75 mL/hr at 07/15/22 1507   07/17/22 **OR** acetaminophen, HYDROmorphone (DILAUDID) injection, ondansetron **OR** ondansetron (ZOFRAN) IV  Antibiotics: Anti-infectives (From admission, onward)    Start     Dose/Rate Route Frequency Ordered Stop   07/15/22 1800  Ampicillin-Sulbactam (UNASYN) 3 g in sodium chloride 0.9 % 100 mL IVPB        3 g 200 mL/hr over 30 Minutes Intravenous Every 6 hours 07/15/22 1423     07/15/22 1115  Ampicillin-Sulbactam (UNASYN) 3 g in sodium chloride 0.9 % 100 mL IVPB        3 g 200 mL/hr over 30 Minutes Intravenous  Once 07/15/22 1110 07/15/22 1200       Objective:  Vital Signs  Vitals:   07/16/22 0400 07/16/22  0500 07/16/22 0530 07/16/22 0943  BP: 124/73  117/69 (!) 109/58  Pulse: 88  70 68  Resp: 20  20 14   Temp:  97.7 F (36.5 C)  97.7 F (36.5 C)  TempSrc:  Oral  Oral  SpO2: 98%  95% 100%    Intake/Output Summary (Last 24 hours) at 07/16/2022 1110 Last data filed at 07/15/2022 1425 Gross per 24 hour  Intake 1095.67 ml   Output --  Net 1095.67 ml   There were no vitals filed for this visit.  General appearance: Awake alert.  In no distress Mild right-sided tonsillar enlargement noted. Resp: Clear to auscultation bilaterally.  Normal effort Cardio: S1-S2 is normal regular.  No S3-S4.  No rubs murmurs or bruit GI: Abdomen is soft.  Nontender nondistended.  Bowel sounds are present normal.  No masses organomegaly Extremities: No edema.  Full range of motion of lower extremities. Neurologic: Alert and oriented x3.  No focal neurological deficits.    Lab Results:  Data Reviewed: I have personally reviewed following labs and reports of the imaging studies  CBC: Recent Labs  Lab 07/15/22 1127 07/16/22 0319  WBC 10.5 11.8*  NEUTROABS 7.7  --   HGB 15.8* 13.4  HCT 46.9* 38.9  MCV 92.7 91.3  PLT 265 233    Basic Metabolic Panel: Recent Labs  Lab 07/15/22 1127 07/16/22 0319  NA 139 139  K 3.5 3.9  CL 106 111  CO2 25 21*  GLUCOSE 94 130*  BUN 9 13  CREATININE 0.87 0.77  CALCIUM 9.3 8.8*    GFR: Estimated Creatinine Clearance: 98.9 mL/min (by C-G formula based on SCr of 0.77 mg/dL).  Liver Function Tests: Recent Labs  Lab 07/15/22 1127  AST 21  ALT 24  ALKPHOS 85  BILITOT 1.0  PROT 9.3*  ALBUMIN 4.9     Recent Results (from the past 240 hour(s))  SARS Coronavirus 2 by RT PCR (hospital order, performed in [facility] hospital lab) *cepheid single result test* Anterior Nasal Swab     Status: None   Collection Time: 07/15/22 11:33 AM   Specimen: Anterior Nasal Swab  Result Value Ref Range Status   SARS Coronavirus 2 by RT PCR NEGATIVE NEGATIVE Final    Comment: (NOTE) SARS-CoV-2 target nucleic acids are NOT DETECTED.  The SARS-CoV-2 RNA is generally detectable in upper and lower respiratory specimens during the acute phase of infection. The lowest concentration of SARS-CoV-2 viral copies this assay can detect is 250 copies / mL. A negative result does not preclude  SARS-CoV-2 infection and should not be used as the sole basis for treatment or other patient management decisions.  A negative result may occur with improper specimen collection / handling, submission of specimen other than nasopharyngeal swab, presence of viral mutation(s) within the areas targeted by this assay, and inadequate number of viral copies (<250 copies / mL). A negative result must be combined with clinical observations, patient history, and epidemiological information.  Fact Sheet for Patients:   07/17/22  Fact Sheet for Healthcare Providers: RoadLapTop.co.za  This test is not yet approved or  cleared by the http://kim-miller.com/ FDA and has been authorized for detection and/or diagnosis of SARS-CoV-2 by FDA under an Emergency Use Authorization (EUA).  This EUA will remain in effect (meaning this test can be used) for the duration of the COVID-19 declaration under Section 564(b)(1) of the Act, 21 U.S.C. section 360bbb-3(b)(1), unless the authorization is terminated or revoked sooner.  Performed at [facility], [place] [address], [place], [place] [address]   Group A Strep by PCR     Status: None   Collection Time: 07/15/22  2:20 PM  Result Value Ref Range Status   Group A Strep by PCR NOT DETECTED NOT DETECTED Final    Comment: Performed at [facility], [place] [address] [place], [place] [address]      Radiology Studies: CT Soft Tissue Neck W Contrast  Result Date: 07/15/2022 CLINICAL DATA:  Provided history: Epiglottitis or tonsillitis suspected. Additional history provided: Patient reports right ear and throat pain for several days, progressively worsening. Difficulty swallowing. EXAM: CT NECK WITH CONTRAST TECHNIQUE: Multidetector CT imaging of the neck was performed using the standard protocol following the bolus administration of intravenous contrast. RADIATION DOSE REDUCTION:  This exam was performed according to the departmental dose-optimization program which includes automated exposure control, adjustment of the mA and/or kV according to patient size and/or use of iterative reconstruction technique. CONTRAST:  75mL OMNIPAQUE IOHEXOL 300 MG/ML  SOLN COMPARISON:  None. FINDINGS: Pharynx and larynx: Streak and beam hardening artifact arising from dental restoration partially obscures the oral cavity. Asymmetric prominence of the right palatine tonsil. Mucosal/submucosal edema within the right oropharyngeal wall. Prominence of the lingual tonsils, bilaterally. Superimposed 11 mm hypodense focus in the region of the right lingual tonsils, which may reflect an early abscess or focal edema (series 3, image 44). Mild thickening of the epiglottis. Mild-to-moderate edema of the supraglottic larynx, greater on the right. Mild airway narrowing, greatest at the level of the oropharynx/epiglottis. Salivary glands: No inflammation, mass, or stone. Thyroid: Unremarkable. Lymph nodes: Right-sided cervical lymphadenopathy. An index right level 2 lymph node measures 19 mm in short axis. Vascular: The major vascular structures of the neck are patent. Limited intracranial: No evidence of acute intracranial abnormality within the field of view. Visualized orbits: Incompletely imaged. No orbital mass or acute orbital finding at the imaged levels. Mastoids and visualized paranasal sinuses: Trace mucosal thickening within the bilateral maxillary and ethmoid sinuses at the imaged levels. No significant mastoid effusion. Skeleton: Reversal of the expected cervical lordosis. Cervical spondylosis. No acute fracture or aggressive osseous lesion. Upper chest: No consolidation within the imaged lung apices. IMPRESSION: Prominence of the right palatine and bilateral lingual tonsils. Associated mucosal/submucosal edema within the right oropharyngeal wall, epiglottis and right greater than left supraglottic larynx. This  constellation of findings likely reflects tonsillitis/pharyngitis and supraglottitis. Superimposed 11 mm hypodense focus in the region of the right lingual tonsils, which may reflect an early abscess or focal edema. Close clinical follow-up is recommended (with imaging follow-up as warranted) to ensure symptom resolution with treatment, and to exclude a mucosal/submucosal neoplasm. Resultant mild airway narrowing, greatest at the level of the oropharynx/epiglottis. Right cervical lymphadenopathy, likely reactive. Close clinical follow-up is recommended (with imaging follow-up as warranted) to ensure resolution and to exclude alternative etiologies (such as nodal metastatic disease). Electronically Signed   By: Kellie Simmering D.O.   On: 07/15/2022 13:40       LOS: 0 days   [facility] Pager on [email or web address]  07/16/2022, 11:10 AM

## 2022-07-16 NOTE — Plan of Care (Signed)
Patient arrived to the unit in stable condition, she has no needs at this time, she is on room air, and independent with ADL's and ambulation

## 2022-07-16 NOTE — Consult Note (Signed)
Reason for Consult: Pharyngeal swelling Referring Physician: Dr Rushie Chestnut is an 45 y.o. female.  HPI: History of about a week of sore throat.  She was seen in urgent care and not treated with any antibiotics or steroids at that time.  This was approximately 5 to 7 days ago.  She started having increasing swelling and pain in the right side and came to the [facility] emergency room.  CT scan showed some swelling of the right lateral pharynx and supraglottic area with a small hyperlucent area in the lingual tonsil region.  After 24 hours of antibiotics she now feels substantially better to almost close to normal.  She is able to talk now.  She can swallow.  She has never had this problem previously.  She denies any foreign body.  Past Medical History:  Diagnosis Date   Numbness    Weakness     Past Surgical History:  Procedure Laterality Date   ANKLE SURGERY Right    c-section     x 2    Family History  Problem Relation Age of Onset   Healthy Mother    CVA Father     Social History:  reports that she has never smoked. She has never used smokeless tobacco. She reports current alcohol use. She reports that she does not use drugs.  Allergies: No Known Allergies  Medications: I have reviewed the patient's current medications.  Results for orders placed or performed during the hospital encounter of 07/15/22 (from the past 48 hour(s))  CBC with Differential     Status: Abnormal   Collection Time: 07/15/22 11:27 AM  Result Value Ref Range   WBC 10.5 4.0 - 10.5 K/uL   RBC 5.06 3.87 - 5.11 MIL/uL   Hemoglobin 15.8 (H) 12.0 - 15.0 g/dL   HCT 06.2 (H) 69.4 - 85.4 %   MCV 92.7 80.0 - 100.0 fL   MCH 31.2 26.0 - 34.0 pg   MCHC 33.7 30.0 - 36.0 g/dL   RDW 62.7 03.5 - 00.9 %   Platelets 265 150 - 400 K/uL   nRBC 0.0 0.0 - 0.2 %   Neutrophils Relative % 73 %   Neutro Abs 7.7 1.7 - 7.7 K/uL   Lymphocytes Relative 18 %   Lymphs Abs 1.8 0.7 - 4.0 K/uL   Monocytes Relative 9  %   Monocytes Absolute 0.9 0.1 - 1.0 K/uL   Eosinophils Relative 0 %   Eosinophils Absolute 0.0 0.0 - 0.5 K/uL   Basophils Relative 0 %   Basophils Absolute 0.0 0.0 - 0.1 K/uL   Immature Granulocytes 0 %   Abs Immature Granulocytes 0.03 0.00 - 0.07 K/uL    Comment: Performed at [facility], [address], [place], [place] [address]  Comprehensive metabolic panel     Status: Abnormal   Collection Time: 07/15/22 11:27 AM  Result Value Ref Range   Sodium 139 135 - 145 mmol/L   Potassium 3.5 3.5 - 5.1 mmol/L   Chloride 106 98 - 111 mmol/L   CO2 25 22 - 32 mmol/L   Glucose, Bld 94 70 - 99 mg/dL    Comment: Glucose reference range applies only to samples taken after fasting for at least 8 hours.   BUN 9 6 - 20 mg/dL   Creatinine, Ser 9.93 0.44 - 1.00 mg/dL   Calcium 9.3 8.9 - 71.6 mg/dL   Total Protein 9.3 (H) 6.5 - 8.1 g/dL   Albumin 4.9 3.5 -  5.0 g/dL   AST 21 15 - 41 U/L   ALT 24 0 - 44 U/L   Alkaline Phosphatase 85 38 - 126 U/L   Total Bilirubin 1.0 0.3 - 1.2 mg/dL   GFR, Estimated >60 >60 mL/min    Comment: (NOTE) Calculated using the CKD-EPI Creatinine Equation (2021)    Anion gap 8 5 - 15    Comment: Performed at [facility], [place] [address], [place], [place] [address]  Mononucleosis screen     Status: None   Collection Time: 07/15/22 11:27 AM  Result Value Ref Range   Mono Screen NEGATIVE NEGATIVE    Comment: Performed at [facility], [place] [address], [place], [place] [address]  SARS Coronavirus 2 by RT PCR (hospital order, performed in [facility] hospital lab) *cepheid single result test* Anterior Nasal Swab     Status: None   Collection Time: 07/15/22 11:33 AM   Specimen: Anterior Nasal Swab  Result Value Ref Range   SARS Coronavirus 2 by RT PCR NEGATIVE NEGATIVE    Comment: (NOTE) SARS-CoV-2 target nucleic acids are NOT DETECTED.  The SARS-CoV-2 RNA is generally detectable in upper and lower respiratory  specimens during the acute phase of infection. The lowest concentration of SARS-CoV-2 viral copies this assay can detect is 250 copies / mL. A negative result does not preclude SARS-CoV-2 infection and should not be used as the sole basis for treatment or other patient management decisions.  A negative result may occur with improper specimen collection / handling, submission of specimen other than nasopharyngeal swab, presence of viral mutation(s) within the areas targeted by this assay, and inadequate number of viral copies (<250 copies / mL). A negative result must be combined with clinical observations, patient history, and epidemiological information.  Fact Sheet for Patients:   https://www.patel.info/  Fact Sheet for Healthcare Providers: https://hall.com/  This test is not yet approved or  cleared by the [place] FDA and has been authorized for detection and/or diagnosis of SARS-CoV-2 by FDA under an Emergency Use Authorization (EUA).  This EUA will remain in effect (meaning this test can be used) for the duration of the COVID-19 declaration under Section 564(b)(1) of the Act, 21 U.S.C. section 360bbb-3(b)(1), unless the authorization is terminated or revoked sooner.  Performed at [facility], [place] [address] [place], [place] [address]   Group A Strep by PCR     Status: None   Collection Time: 07/15/22  2:20 PM  Result Value Ref Range   Group A Strep by PCR NOT DETECTED NOT DETECTED    Comment: Performed at [facility], [place] [address], [place], [place] [address]  HIV Antibody (routine testing w rflx)     Status: None   Collection Time: 07/15/22  4:07 PM  Result Value Ref Range   HIV Screen 4th Generation wRfx Non Reactive Non Reactive    Comment: Performed at [facility], [place] [address] [place], [place] [address]  Basic metabolic panel     Status: Abnormal   Collection Time:  07/16/22  3:19 AM  Result Value Ref Range   Sodium 139 135 - 145 mmol/L   Potassium 3.9 3.5 - 5.1 mmol/L   Chloride 111 98 - 111 mmol/L   CO2 21 (L) 22 - 32 mmol/L   Glucose, Bld 130 (H) 70 - 99 mg/dL    Comment: Glucose reference range applies only to samples taken after fasting for at least 8  hours.   BUN 13 6 - 20 mg/dL   Creatinine, Ser 6.06 0.44 - 1.00 mg/dL   Calcium 8.8 (L) 8.9 - 10.3 mg/dL   GFR, Estimated >30 >16 mL/min    Comment: (NOTE) Calculated using the CKD-EPI Creatinine Equation (2021)    Anion gap 7 5 - 15    Comment: Performed at [facility], [address] [place], [place] [address]  CBC     Status: Abnormal   Collection Time: 07/16/22  3:19 AM  Result Value Ref Range   WBC 11.8 (H) 4.0 - 10.5 K/uL   RBC 4.26 3.87 - 5.11 MIL/uL   Hemoglobin 13.4 12.0 - 15.0 g/dL   HCT 23.5 57.3 - 22.0 %   MCV 91.3 80.0 - 100.0 fL   MCH 31.5 26.0 - 34.0 pg   MCHC 34.4 30.0 - 36.0 g/dL   RDW 25.4 27.0 - 62.3 %   Platelets 233 150 - 400 K/uL   nRBC 0.0 0.0 - 0.2 %    Comment: Performed at [facility], [address], [place], [place] [address]    CT Soft Tissue Neck W Contrast  Result Date: 07/15/2022 CLINICAL DATA:  Provided history: Epiglottitis or tonsillitis suspected. Additional history provided: Patient reports right ear and throat pain for several days, progressively worsening. Difficulty swallowing. EXAM: CT NECK WITH CONTRAST TECHNIQUE: Multidetector CT imaging of the neck was performed using the standard protocol following the bolus administration of intravenous contrast. RADIATION DOSE REDUCTION: This exam was performed according to the departmental dose-optimization program which includes automated exposure control, adjustment of the mA and/or kV according to patient size and/or use of iterative reconstruction technique. CONTRAST:  10mL OMNIPAQUE IOHEXOL 300 MG/ML  SOLN COMPARISON:  None. FINDINGS: Pharynx and larynx: Streak and  beam hardening artifact arising from dental restoration partially obscures the oral cavity. Asymmetric prominence of the right palatine tonsil. Mucosal/submucosal edema within the right oropharyngeal wall. Prominence of the lingual tonsils, bilaterally. Superimposed 11 mm hypodense focus in the region of the right lingual tonsils, which may reflect an early abscess or focal edema (series 3, image 44). Mild thickening of the epiglottis. Mild-to-moderate edema of the supraglottic larynx, greater on the right. Mild airway narrowing, greatest at the level of the oropharynx/epiglottis. Salivary glands: No inflammation, mass, or stone. Thyroid: Unremarkable. Lymph nodes: Right-sided cervical lymphadenopathy. An index right level 2 lymph node measures 19 mm in short axis. Vascular: The major vascular structures of the neck are patent. Limited intracranial: No evidence of acute intracranial abnormality within the field of view. Visualized orbits: Incompletely imaged. No orbital mass or acute orbital finding at the imaged levels. Mastoids and visualized paranasal sinuses: Trace mucosal thickening within the bilateral maxillary and ethmoid sinuses at the imaged levels. No significant mastoid effusion. Skeleton: Reversal of the expected cervical lordosis. Cervical spondylosis. No acute fracture or aggressive osseous lesion. Upper chest: No consolidation within the imaged lung apices. IMPRESSION: Prominence of the right palatine and bilateral lingual tonsils. Associated mucosal/submucosal edema within the right oropharyngeal wall, epiglottis and right greater than left supraglottic larynx. This constellation of findings likely reflects tonsillitis/pharyngitis and supraglottitis. Superimposed 11 mm hypodense focus in the region of the right lingual tonsils, which may reflect an early abscess or focal edema. Close clinical follow-up is recommended (with imaging follow-up as warranted) to ensure symptom resolution with treatment,  and to exclude a mucosal/submucosal neoplasm. Resultant mild airway narrowing, greatest at the level of the oropharynx/epiglottis. Right cervical lymphadenopathy, likely reactive.  Close clinical follow-up is recommended (with imaging follow-up as warranted) to ensure resolution and to exclude alternative etiologies (such as nodal metastatic disease). Electronically Signed   By: Jackey Loge D.O.   On: 07/15/2022 13:40    ROS Blood pressure (!) 109/58, pulse 68, temperature 97.7 F (36.5 C), temperature source Oral, resp. rate 14, SpO2 100 %. Physical Exam Constitutional:      Appearance: Normal appearance.  HENT:     Head: Normocephalic.     Nose: Nose normal.     Mouth/Throat:     Mouth: Mucous membranes are moist.     Comments: The tongue and floor mouth look normal.  The pharynx has no evidence of any swelling.  Tonsils are both normal-appearing. Neck:     Comments: No swelling or tenderness Musculoskeletal:     Cervical back: Normal range of motion and neck supple.  Neurological:     Mental Status: She is alert.       Assessment/Plan: Pharyngeal swelling-clinically she is profoundly better and probably can be discharged now on steroid taper and antibiotic therapy.  We discussed a fiberoptic scope but since she is so much better I do not see a specific need to proceed with that at this time and rather just treat her clinically and she does not want to a fiberoptic scope.  She can follow-up at [phone] if she continues to have issues with ENT outpatient.  Suzanna Obey 07/16/2022, 11:22 AM

## 2022-07-17 DIAGNOSIS — J029 Acute pharyngitis, unspecified: Secondary | ICD-10-CM | POA: Diagnosis not present

## 2022-07-17 LAB — CBC
HCT: 40.7 % (ref 36.0–46.0)
Hemoglobin: 14 g/dL (ref 12.0–15.0)
MCH: 31.5 pg (ref 26.0–34.0)
MCHC: 34.4 g/dL (ref 30.0–36.0)
MCV: 91.5 fL (ref 80.0–100.0)
Platelets: 266 10*3/uL (ref 150–400)
RBC: 4.45 MIL/uL (ref 3.87–5.11)
RDW: 12.1 % (ref 11.5–15.5)
WBC: 18.7 10*3/uL — ABNORMAL HIGH (ref 4.0–10.5)
nRBC: 0 % (ref 0.0–0.2)

## 2022-07-17 LAB — BASIC METABOLIC PANEL
Anion gap: 8 (ref 5–15)
BUN: 16 mg/dL (ref 6–20)
CO2: 24 mmol/L (ref 22–32)
Calcium: 8.6 mg/dL — ABNORMAL LOW (ref 8.9–10.3)
Chloride: 108 mmol/L (ref 98–111)
Creatinine, Ser: 0.86 mg/dL (ref 0.44–1.00)
GFR, Estimated: >60 mL/min (ref >60)
Glucose, Bld: 121 mg/dL — ABNORMAL HIGH (ref 70–99)
Potassium: 3.9 mmol/L (ref 3.5–5.1)
Sodium: 140 mmol/L (ref 135–145)

## 2022-07-17 MED ORDER — AMOXICILLIN-POT CLAVULANATE 875-125 MG PO TABS: 1.0000 | ORAL_TABLET | Freq: Two times a day (BID) | ORAL | Status: DC

## 2022-07-17 MED ORDER — DEXAMETHASONE 4 MG PO TABS: 4.0000 mg | ORAL_TABLET | Freq: Two times a day (BID) | ORAL | Status: DC

## 2022-07-17 MED ORDER — DEXAMETHASONE 4 MG PO TABS: ORAL_TABLET | ORAL | 0 refills | Status: AC

## 2022-07-17 MED ORDER — AMOXICILLIN-POT CLAVULANATE 875-125 MG PO TABS: 1.0000 | ORAL_TABLET | Freq: Two times a day (BID) | ORAL | 0 refills | Status: AC

## 2022-07-17 NOTE — Progress Notes (Signed)
  Transition of Care [facility]) Screening Note   Patient Details  Name: Mykael Trott Date of Birth: [birth year]   [facility]) CM/SW Contact:    Vassie Moselle, LCSW Phone Number: 07/17/2022, 12:33 PM    Transition of Care Department [facility]) has reviewed patient and no TOC needs have been identified at this time. We will continue to monitor patient advancement through interdisciplinary progression rounds. If new patient transition needs arise, please place a TOC consult.

## 2022-07-17 NOTE — Discharge Summary (Signed)
[facility]  Physician Discharge Summary   Patient ID: Rebecca Holden MRN: [id number] DOB/AGE: Sep 24, 1976 46 y.o.  Admit date: 07/15/2022 Discharge date: 07/17/2022    PCP: No primary care provider on file.  DISCHARGE DIAGNOSES:  Tonsillar abscess   Inability to swallow   Acute pharyngitis   RECOMMENDATIONS FOR OUTPATIENT FOLLOW UP: Patient instructed to follow-up with ENT if her symptoms do not improve or recur   Home Health: None Equipment/Devices: None  CODE STATUS: Full code code  DISCHARGE CONDITION: fair  Diet recommendation: As tolerated  INITIAL HISTORY: 46 y.o. female without significant medical history. Comes into the ER with 4 days of worsening throat pain/ear pain.  She also has had difficulty swallowing.  In the ER, CT scan done shows: Prominence of the right palatine and bilateral lingual tonsils.  Associated mucosal/submucosal edema within the right oropharyngeal wall, epiglottis and right greater than left supraglottic larynx. This constellation of findings likely reflects tonsillitis/pharyngitis and supraglottitis. Superimposed 11 mm hypodense focus in the region of the right lingual tonsils, which may reflect an early abscess or focal edema. Close clinical follow-up is recommended (with imaging follow-up as warranted) to ensure symptom resolution with treatment, and to exclude a mucosal/submucosal neoplasm.  Discussions with ENT, Dr. Jearld Fenton patient was started on IV antibiotics dexamethasone.  COVID-19 was negative.   Consultants:  Dr. Jearld Fenton with ENT   Procedures: None    HOSPITAL COURSE:   Acute pharyngitis with concern for small tonsillar abscess CT scan raised concern for tonsillitis and perhaps a small abscess.  Patient had a lot of swelling in her throat and neck.  Had difficulty swallowing.  Patient was given dexamethasone and started on Unasyn.  Patient started improving.  Seen by ENT.  Transition to oral antibiotics and oral steroid taper.    Group A strep screen was negative.  Mono screen was negative.  COVID-19 test was negative.   Obesity Estimated body mass index is 41.52 kg/m as calculated from the following:   Height as of 07/14/22: 5\' 2"  (1.575 m).   Weight as of 07/14/22: 103 kg.     Patient has improved.  Tolerated her diet.  Okay for discharge home today.   PERTINENT LABS:  The results of significant diagnostics from this hospitalization (including imaging, microbiology, ancillary and laboratory) are listed below for reference.    Microbiology: Recent Results (from the past 240 hour(s))  SARS Coronavirus 2 by RT PCR (hospital order, performed in [facility] hospital lab) *cepheid single result test* Anterior Nasal Swab     Status: None   Collection Time: 07/15/22 11:33 AM   Specimen: Anterior Nasal Swab  Result Value Ref Range Status   SARS Coronavirus 2 by RT PCR NEGATIVE NEGATIVE Final    Comment: (NOTE) SARS-CoV-2 target nucleic acids are NOT DETECTED.  The SARS-CoV-2 RNA is generally detectable in upper and lower respiratory specimens during the acute phase of infection. The lowest concentration of SARS-CoV-2 viral copies this assay can detect is 250 copies / mL. A negative result does not preclude SARS-CoV-2 infection and should not be used as the sole basis for treatment or other patient management decisions.  A negative result may occur with improper specimen collection / handling, submission of specimen other than nasopharyngeal swab, presence of viral mutation(s) within the areas targeted by this assay, and inadequate number of viral copies (<250 copies / mL). A negative result must be combined with clinical observations, patient history, and epidemiological information.  Fact Sheet for Patients:  https://www.patel.info/  Fact Sheet for Healthcare Providers: https://hall.com/  This test is not yet approved or  cleared by the [place] FDA  and has been authorized for detection and/or diagnosis of SARS-CoV-2 by FDA under an Emergency Use Authorization (EUA).  This EUA will remain in effect (meaning this test can be used) for the duration of the COVID-19 declaration under Section 564(b)(1) of the Act, 21 U.S.C. section 360bbb-3(b)(1), unless the authorization is terminated or revoked sooner.  Performed at [facility], [place] [address], [place], [place] [address]   Group A Strep by PCR     Status: None   Collection Time: 07/15/22  2:20 PM  Result Value Ref Range Status   Group A Strep by PCR NOT DETECTED NOT DETECTED Final    Comment: Performed at [facility], [place] [address] [place], [place] [address]     Labs:   Basic Metabolic Panel: Recent Labs  Lab 07/15/22 1127 07/16/22 0319 07/17/22 0527  NA 139 139 140  K 3.5 3.9 3.9  CL 106 111 108  CO2 25 21* 24  GLUCOSE 94 130* 121*  BUN 9 13 16   CREATININE 0.87 0.77 0.86  CALCIUM 9.3 8.8* 8.6*   Liver Function Tests: Recent Labs  Lab 07/15/22 1127  AST 21  ALT 24  ALKPHOS 85  BILITOT 1.0  PROT 9.3*  ALBUMIN 4.9    CBC: Recent Labs  Lab 07/15/22 1127 07/16/22 0319 07/17/22 0527  WBC 10.5 11.8* 18.7*  NEUTROABS 7.7  --   --   HGB 15.8* 13.4 14.0  HCT 46.9* 38.9 40.7  MCV 92.7 91.3 91.5  PLT 265 233 266      IMAGING STUDIES CT Soft Tissue Neck W Contrast  Result Date: 07/15/2022 CLINICAL DATA:  Provided history: Epiglottitis or tonsillitis suspected. Additional history provided: Patient reports right ear and throat pain for several days, progressively worsening. Difficulty swallowing. EXAM: CT NECK WITH CONTRAST TECHNIQUE: Multidetector CT imaging of the neck was performed using the standard protocol following the bolus administration of intravenous contrast. RADIATION DOSE REDUCTION: This exam was performed according to the departmental dose-optimization program which includes automated exposure control,  adjustment of the mA and/or kV according to patient size and/or use of iterative reconstruction technique. CONTRAST:  44mL OMNIPAQUE IOHEXOL 300 MG/ML  SOLN COMPARISON:  None. FINDINGS: Pharynx and larynx: Streak and beam hardening artifact arising from dental restoration partially obscures the oral cavity. Asymmetric prominence of the right palatine tonsil. Mucosal/submucosal edema within the right oropharyngeal wall. Prominence of the lingual tonsils, bilaterally. Superimposed 11 mm hypodense focus in the region of the right lingual tonsils, which may reflect an early abscess or focal edema (series 3, image 44). Mild thickening of the epiglottis. Mild-to-moderate edema of the supraglottic larynx, greater on the right. Mild airway narrowing, greatest at the level of the oropharynx/epiglottis. Salivary glands: No inflammation, mass, or stone. Thyroid: Unremarkable. Lymph nodes: Right-sided cervical lymphadenopathy. An index right level 2 lymph node measures 19 mm in short axis. Vascular: The major vascular structures of the neck are patent. Limited intracranial: No evidence of acute intracranial abnormality within the field of view. Visualized orbits: Incompletely imaged. No orbital mass or acute orbital finding at the imaged levels. Mastoids and visualized paranasal sinuses: Trace mucosal thickening within the bilateral maxillary and ethmoid sinuses at the imaged levels. No significant mastoid effusion. Skeleton: Reversal of the expected cervical lordosis. Cervical spondylosis. No acute fracture or aggressive osseous lesion. Upper chest: No consolidation within  the imaged lung apices. IMPRESSION: Prominence of the right palatine and bilateral lingual tonsils. Associated mucosal/submucosal edema within the right oropharyngeal wall, epiglottis and right greater than left supraglottic larynx. This constellation of findings likely reflects tonsillitis/pharyngitis and supraglottitis. Superimposed 11 mm hypodense focus  in the region of the right lingual tonsils, which may reflect an early abscess or focal edema. Close clinical follow-up is recommended (with imaging follow-up as warranted) to ensure symptom resolution with treatment, and to exclude a mucosal/submucosal neoplasm. Resultant mild airway narrowing, greatest at the level of the oropharynx/epiglottis. Right cervical lymphadenopathy, likely reactive. Close clinical follow-up is recommended (with imaging follow-up as warranted) to ensure resolution and to exclude alternative etiologies (such as nodal metastatic disease). Electronically Signed   By: Kellie Simmering D.O.   On: 07/15/2022 13:40    DISCHARGE EXAMINATION: Vitals:   07/16/22 1414 07/16/22 1544 07/16/22 1950 07/17/22 0454  BP:  112/65 121/71 106/66  Pulse:  79 70 63  Resp:  18 18 16   Temp: 98.3 F (36.8 C) 98.3 F (36.8 C) 98.6 F (37 C) 98.2 F (36.8 C)  TempSrc: Oral  Oral Oral  SpO2:   100% 98%  Weight:  103 kg    Height:  5' 2.01" (1.575 m)     General appearance: Awake alert.  In no distress Resp: Clear to auscultation bilaterally.  Normal effort Cardio: S1-S2 is normal regular.  No S3-S4.  No rubs murmurs or bruit GI: Abdomen is soft.  Nontender nondistended.  Bowel sounds are present normal.  No masses organomegaly  DISPOSITION: Home  Discharge Instructions     Call MD for:  difficulty breathing, headache or visual disturbances   Complete by: As directed    Call MD for:  extreme fatigue   Complete by: As directed    Call MD for:  persistant dizziness or light-headedness   Complete by: As directed    Call MD for:  persistant nausea and vomiting   Complete by: As directed    Call MD for:  severe uncontrolled pain   Complete by: As directed    Call MD for:  temperature >100.4   Complete by: As directed    Discharge instructions   Complete by: As directed    Please take your medications as prescribed.  Follow-up with ENT if your symptoms worsen.  Eat a soft diet for the  next 2 to 3 days and then you can advance as tolerated.  You were cared for by a hospitalist during your hospital stay. If you have any questions about your discharge medications or the care you received while you were in the hospital after you are discharged, you can call the unit and asked to speak with the hospitalist on call if the hospitalist that took care of you is not available. Once you are discharged, your primary care physician will handle any further medical issues. Please note that NO REFILLS for any discharge medications will be authorized once you are discharged, as it is imperative that you return to your primary care physician (or establish a relationship with a primary care physician if you do not have one) for your aftercare needs so that they can reassess your need for medications and monitor your lab values. If you do not have a primary care physician, you can call [phone] for a physician referral.   Increase activity slowly   Complete by: As directed           Allergies as of 07/17/2022  No Known Allergies      Medication List     TAKE these medications    acetaminophen 500 MG tablet Commonly known as: TYLENOL Take 500 mg by mouth every 6 (six) hours as needed for moderate pain.   amoxicillin-clavulanate 875-125 MG tablet Commonly known as: AUGMENTIN Take 1 tablet by mouth every 12 (twelve) hours for 7 days.   dexamethasone 4 MG tablet Commonly known as: DECADRON Take 1 tablet twice a day for 3 days followed by 1 tablet once a day for 3 days and then stop   ibuprofen 200 MG tablet Commonly known as: ADVIL Take 200 mg by mouth every 6 (six) hours as needed for mild pain.          Follow-up Information     Melissa Montane, MD Follow up.   Specialty: Otolaryngology Why: As needed Contact information: [address] [place] [address] [phone]                 TOTAL DISCHARGE TIME: 35 minutes  [facility] Pager on [email or web address]  07/18/2022, 2:14 PM

## 2023-05-08 ENCOUNTER — Other Ambulatory Visit: Payer: Self-pay | Admitting: Obstetrics & Gynecology

## 2023-05-08 DIAGNOSIS — Z1231 Encounter for screening mammogram for malignant neoplasm of breast: Secondary | ICD-10-CM

## 2023-05-12 ENCOUNTER — Ambulatory Visit: Admission: RE | Admit: 2023-05-12 | Payer: Medicaid Other | Source: Ambulatory Visit

## 2023-05-12 DIAGNOSIS — Z1231 Encounter for screening mammogram for malignant neoplasm of breast: Secondary | ICD-10-CM

## 2023-12-08 ENCOUNTER — Other Ambulatory Visit (HOSPITAL_BASED_OUTPATIENT_CLINIC_OR_DEPARTMENT_OTHER): Payer: Self-pay

## 2023-12-08 ENCOUNTER — Encounter (HOSPITAL_BASED_OUTPATIENT_CLINIC_OR_DEPARTMENT_OTHER): Payer: Self-pay

## 2023-12-08 MED ORDER — WEGOVY 0.25 MG/0.5ML ~~LOC~~ SOAJ
0.2500 mg | SUBCUTANEOUS | 0 refills | Status: AC
  Filled 2023-12-08: qty 2, 28d supply, fill #0

## 2024-01-01 ENCOUNTER — Other Ambulatory Visit (HOSPITAL_BASED_OUTPATIENT_CLINIC_OR_DEPARTMENT_OTHER): Payer: Self-pay

## 2024-01-07 ENCOUNTER — Other Ambulatory Visit (HOSPITAL_BASED_OUTPATIENT_CLINIC_OR_DEPARTMENT_OTHER): Payer: Self-pay

## 2024-05-17 ENCOUNTER — Other Ambulatory Visit: Payer: Self-pay | Admitting: Obstetrics & Gynecology

## 2024-05-17 DIAGNOSIS — Z1231 Encounter for screening mammogram for malignant neoplasm of breast: Secondary | ICD-10-CM

## 2024-06-02 ENCOUNTER — Ambulatory Visit
Admission: RE | Admit: 2024-06-02 | Discharge: 2024-06-02 | Disposition: A | Discharge status: Home / Self Care | Source: Ambulatory Visit | Attending: Obstetrics & Gynecology | Admitting: Obstetrics & Gynecology

## 2024-06-02 DIAGNOSIS — Z1231 Encounter for screening mammogram for malignant neoplasm of breast: Secondary | ICD-10-CM
